# Patient Record
Sex: Female | Born: 1985 | Race: Black or African American | Hispanic: No | Marital: Single | Smoking: Never smoker
Health system: Southern US, Community
[De-identification: ages and names within clinical notes are randomized; demographics above are authoritative.]

## PROBLEM LIST (undated history)

## (undated) ENCOUNTER — Inpatient Hospital Stay (HOSPITAL_COMMUNITY): Payer: Self-pay

## (undated) DIAGNOSIS — Z87898 Personal history of other specified conditions: Secondary | ICD-10-CM

## (undated) DIAGNOSIS — IMO0002 Reserved for concepts with insufficient information to code with codable children: Secondary | ICD-10-CM

## (undated) DIAGNOSIS — D649 Anemia, unspecified: Secondary | ICD-10-CM

## (undated) DIAGNOSIS — T7840XA Allergy, unspecified, initial encounter: Secondary | ICD-10-CM

## (undated) DIAGNOSIS — B379 Candidiasis, unspecified: Secondary | ICD-10-CM

## (undated) DIAGNOSIS — Z8719 Personal history of other diseases of the digestive system: Secondary | ICD-10-CM

## (undated) DIAGNOSIS — I8393 Asymptomatic varicose veins of bilateral lower extremities: Secondary | ICD-10-CM

## (undated) DIAGNOSIS — Z9889 Other specified postprocedural states: Secondary | ICD-10-CM

## (undated) DIAGNOSIS — Z8619 Personal history of other infectious and parasitic diseases: Secondary | ICD-10-CM

## (undated) DIAGNOSIS — A599 Trichomoniasis, unspecified: Secondary | ICD-10-CM

## (undated) HISTORY — DX: Candidiasis, unspecified: B37.9

## (undated) HISTORY — DX: Other specified postprocedural states: Z98.890

## (undated) HISTORY — DX: Asymptomatic varicose veins of bilateral lower extremities: I83.93

## (undated) HISTORY — DX: Personal history of other infectious and parasitic diseases: Z86.19

## (undated) HISTORY — DX: Personal history of other diseases of the digestive system: Z87.19

## (undated) HISTORY — DX: Reserved for concepts with insufficient information to code with codable children: IMO0002

## (undated) HISTORY — DX: Anemia, unspecified: D64.9

## (undated) HISTORY — PX: WISDOM TOOTH EXTRACTION: SHX21

## (undated) HISTORY — DX: Personal history of other specified conditions: Z87.898

## (undated) HISTORY — DX: Allergy, unspecified, initial encounter: T78.40XA

## (undated) HISTORY — DX: Trichomoniasis, unspecified: A59.9

---
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
## 2008-06-12 ENCOUNTER — Inpatient Hospital Stay (HOSPITAL_COMMUNITY): Admission: AD | Admit: 2008-06-12 | Discharge: 2008-06-16 | Payer: Self-pay | Admitting: Obstetrics and Gynecology

## 2008-06-12 DIAGNOSIS — D649 Anemia, unspecified: Secondary | ICD-10-CM

## 2008-06-12 HISTORY — DX: Anemia, unspecified: D64.9

## 2010-08-13 LAB — CBC
HCT: 41.6 % (ref 36.0–46.0)
Hemoglobin: 13.6 g/dL (ref 12.0–15.0)
Hemoglobin: 10.8 g/dL — ABNORMAL LOW (ref 12.0–15.0)
MCHC: 33.7 g/dL (ref 30.0–36.0)
MCV: 81.6 fL (ref 78.0–100.0)
MCV: 81.6 fL (ref 78.0–100.0)
Platelets: 205 10*3/uL (ref 150–400)
RBC: 3.93 MIL/uL (ref 3.87–5.11)
RDW: 14.9 % (ref 11.5–15.5)
RDW: 14.7 % (ref 11.5–15.5)
WBC: 9.8 10*3/uL (ref 4.0–10.5)

## 2010-09-10 NOTE — H&P (Signed)
NAMESHANNARA, WINBUSH               ACCOUNT NO.:  [id number]   MEDICAL RECORD NO.:  [id number]          PATIENT TYPE:  INP   LOCATION:  9170                          [facility]ILITY[facility]  [facility]   PHYSICIAN:  Crist Fat. Rivard, M.D. DATE OF BIRTH:  [birth year]   DATE OF ADMISSION:  06/12/2008  DATE OF DISCHARGE:                              HISTORY & PHYSICAL   [name] is a 24 year old, G2, P0-0-1-0, at 40.0 weeks' gestation who  presents today with spontaneous rupture of membranes and clear fluid.  [name] is followed by the midwives at [facility].   Her pregnancy is remarkable for:  1. History of abnormal Pap.  2. History of STDs.   PRENATAL LABORATORY DATA:  Include an initial hemoglobin of 12.8,  hematocrit 38.5, platelets 250,000.  Blood type A+, antibody screen  negative, sickle cell trait screen negative, RPR nonreactive, rubella  titer immune, hepatitis B negative, HIV negative, Pap normal, gonorrhea  negative, Chlamydia negative, 27-week Glucola normal at 114, and GBS  positive at 36 weeks.   HISTORY OF PRESENT PREGNANCY:  [name] presented for prenatal care  at 10 weeks' gestation requesting midwifery management.  Two weeks  later, she had a normal first trimester screen.  At 19 weeks, she had an  anatomy ultrasound which showed a single intrauterine pregnancy, normal  fluid, normal anatomy, a cervix 3.52 cm, size concordant with dates,  normal placenta, three-vessel cord.  At 23 weeks, she began to have  problems with hemorrhoids and round ligament pain.  She was given  ProctoFoam and counseled on appropriate foot wear and back and pelvic  exercises.  At 27 weeks, she had her Glucola challenge test which was  normal and her H1N1 vaccine.  At 29 weeks, she was treated for a yeast  infection with a prescription of Terazol 7 cream.  The last month of her  pregnancy has been unremarkable except for the positive GBS culture on  January 19.   CURRENT MEDICATIONS:   Prenatal vitamin.   OBSTETRICAL HISTORY:  Rhett Bannister 1 was a first trimester termination in  January 2008.  Gravida 2 present pregnancy.   ALLERGIES:  [name] does not have any medication, food, or latex  allergies.   MEDICAL HISTORY:  [name] did have an abnormal Pap and a colposcopy  back in 2004.  She had a termination in 2008.  She had history of  Trichomonas.  She has had some yeast infections in the past.   SURGICAL HISTORY:  Her only surgery was the termination of pregnancy.   FAMILY HISTORY:  Her maternal grandmother has chronic hypertension.  Her  half sister has varicose veins.  Her nephew has asthma.  Her maternal  grandmother and maternal grandfather have diabetes.  Her cousin is on  dialysis.  Her maternal grandmother has breast cancer.  Her mother has  been a victim of domestic vitamins including physical and emotional  abuse.  Her maternal grandmother, maternal grandfather, maternal aunt,  maternal uncle have nicotine addictions.  Her maternal uncles have  problems with alcohol and drugs, and she has a  maternal aunt with  scoliosis.  Her maternal grandmother had twins.   GENETIC HISTORY:  [name] is negative for sickle cell trait  screening.  There are no other contributing genetic risk factors.   SOCIAL HISTORY:  [name] is a single black female with 16 years of  education who works in Producer, television/film/video.  Her partner is named Radio producer .  He has 13 years of education and works in detail.  [name] states  her religion as [employer].  She reports 1 drink on May 31 and denies any  further use of alcohol, tobacco or any street drug use during the  pregnancy.   PHYSICAL EXAMINATION:  Was within normal limits.  HEENT:  Normal.  LUNGS:  Clear to auscultation bilaterally.  HEART:  Regular rate and rhythm without murmurs.  BREASTS:  Soft, nontender.  ABDOMEN:  Gravid, 40 cm, fundal height appropriate for gestational age.  Soft uterine resting tone between  contractions.  EXTREMITIES:  Without edema.  Normal DTRs.  Negative Homans and clonus.  Fetal heart rate has a baseline 120 with some early reactivity on  arrival and a current sleep pattern.  VAGINAL EXAM:  was 160, -3, somewhat ballotable, vertex.  There was an  irregular mild contraction pattern present.   IMPRESSION:  A 25 year old G2, P0-0-1-0 at 40.0 weeks, spontaneous  rupture of membranes, minor contractions, positive GBS, reassuring fetal  heart rate.   PLAN:  Admit to birthing suite.  Routine admission orders.  Penicillin  prophylaxis for GBS.  The patient may ambulate once first penicillin  dose is in and reactivity of fetal heart rate strip is again obtained.  Consider the use of Pitocin if the contraction pattern does not improve  in the next two hours.  Anticipate NSVD.  CNM management.  Notify M.D.  on the service.      Janna Melsness, CNM      Sandra A. Rivard, M.D.  Electronically Signed    JM/MEDQ  D:  06/12/2008  T:  06/12/2008  Job:  [id number]

## 2010-09-10 NOTE — Op Note (Signed)
NAMELIESL, SIMONS               ACCOUNT NO.:  [id number]   MEDICAL RECORD NO.:  [id number]          PATIENT TYPE:  INP   LOCATION:  9170                          [facility]  [facility]   PHYSICIAN:  Janine Limbo, M.D.DATE OF BIRTH:  07/19/1985   DATE OF PROCEDURE:  DATE OF DISCHARGE:                               OPERATIVE REPORT   PREOPERATIVE DIAGNOSES:  1. 40-1/[redacted] weeks gestation.  2. Failure to progress in labor.  3. Meconium-stained amniotic fluid.   POSTOPERATIVE DIAGNOSES:  1. 40-1/[redacted] weeks gestation.  2. Failure to progress in labor.  3. Meconium-stained amniotic fluid.  4. Persistent occiput transverse presentation.   PROCEDURE:  Primary low-transverse cesarean section.   SURGEON:  Janine Limbo, MD   FIRST ASSISTANT:  Eulogio Bear, CNM.   ANESTHESIA:  Spinal.   DISPOSITION:  Sharon Mcbride is a 25 year old female, gravida 2, para 0-0-1-  0, who has been followed at the [facility].  The patient  presented to the [facility] [place] on February 15 at  approximately 4:30 a.m.  She was noted to have spontaneous rupture of  membranes.  Her cervix was 1 cm dilated, 60% effaced, and the presenting  part was at a -3 station.  The patient labored very slowly throughout  the day.  She was started on Pitocin.  She dilated her cervix to 5 cm  but progressed no further in spite of adequate [place] units.  She  was noted to have persistent variable decelerations with each of her  contractions.  We discussed our management options and the patient  elected to proceed with cesarean section.  The specific risk of cesarean  section were reviewed including, but not limited to, anesthetic  complications, bleeding, infections, and possible damage to surrounding  organs.   FINDINGS:  A 6 pounds 13 ounces female infant Kyung Rudd) was delivered  from a persistent left occiput transverse  presentation.  The Apgar  scores were 5 at 1 minute 7 and at 5 minutes.  The uterus was noted to  be normal for the gravid state.   PROCEDURE IN DETAIL:  The patient was taken to the operating room where  a spinal anesthetic was given.  The patient's abdomen, perineum, and  vagina were prepped with multiple layers of Betadine.  Foley catheter  was placed in the bladder.  The patient was then sterilely draped.  The  lower abdomen was injected with 10 mL of 0.5%  Marcaine with  epinephrine.  A low transverse incision was made in the abdomen and  carried sharply through the subcutaneous tissue, fascia, and the  anterior peritoneum.  An incision was made in the lower uterine segment  and extended in a low transverse fashion.  The fetal head was delivered.  The mouth and nose were suctioned using the DeLee trap.  A nuchal cord  was reduced.  The remainder of the infant was then delivered.  The cord  was clamped and cut and infant was handed to the awaiting pediatric  team.  Routine cord blood studies  were obtained.  The placenta was then  removed.  The placenta was given to the cord blood registry team.  The  uterine cavity was cleaned of amniotic fluid, clotted blood, and  membranes.  Uterine incision was closed using a running locking suture  of 2-0 Vicryl followed by an imbricating suture of 2-0 Vicryl.  Hemostasis was achieved using figure-of-eight sutures of 2-0 Vicryl.  The pelvis was vigorously irrigated.  The anterior peritoneum and the  abdominal musculature were reapproximated in the midline using 2-0  Vicryl.  The fascia was irrigated.  The fascia was then closed using a  running suture of zero Vicryl followed by three interrupted sutures of 0  Vicryl.  The subcutaneous layer was closed using a running suture of 0  Vicryl.  The skin was reapproximated using a subcuticular suture of 3-0  Monocryl.  Sponge, needle, and instrument counts were correct on two  occasions.  The estimated  blood loss was 800 mL.  The patient tolerated  her procedure well.  The patient was taken to the recovery room in  stable condition.  She was noted to drain clear yellow urine.  The  infant was taken to the full-term nursery in stable condition.      Janine Limbo, M.D.  Electronically Signed     AVS/MEDQ  D:  06/13/2008  T:  06/13/2008  Job:  [id number]

## 2010-09-13 NOTE — Discharge Summary (Signed)
[name], Mcbride               ACCOUNT NO.:  [id number]   MEDICAL RECORD NO.:  [id number]          PATIENT TYPE:  INP   LOCATION:  9130                          [facility]   PHYSICIAN:  Crist Fat. Rivard, M.D. DATE OF BIRTH:  10-25-1985   DATE OF ADMISSION:  06/12/2008  DATE OF DISCHARGE:  06/16/2008                               DISCHARGE SUMMARY   [name] is a 25 year old gravida 2, now para 1-0-1-1, who was  admitted for the delivery of her daughter, Joycie Peek who was born on  June 13, 2010, at 3:09 a.m. and weighed 6 pounds 13 ounces with  Apgar scores of 5 at 1 minute and 7 at 5 minutes.  She was delivered by  cesarean section.   ADMISSION DIAGNOSES:  1. Intrauterine pregnancy at 40 weeks.  2. Spontaneous rupture of membranes.  3. Positive group B streptococcus.  4. History of abnormal Pap/colpo.   DISCHARGE DIAGNOSES:  1. Three days status post primary low transverse cesarean section for      full-term pregnancy.  2. Spontaneous rupture of membranes x24 hours before delivery.  3. Meconium-stained fluid.  4. History of abnormal Pap/colpo.  5. Anemia.   PERTINENT LABORATORY DATA:  [name] is A+, rubella immune, and  positive for GBS.  WBC 10.0 (9.8), HGB 10.8 (13.60, HCT 32.1 (41.6), PLT  163 (205).   PROCEDURES:  Pitocin and penicillin per protocol.  Spinal anesthesia,  primary LTCS.  On day of discharge, Ms. Collins was afebrile with stable  vital signs.  Her physical exam was normal and unremarkable.  Her lungs  were clear to auscultation bilaterally.  Her heart had regular rate and  rhythm without murmurs.  Her breasts were soft and filling with breast-  feeding progressing well.  Her abdomen was soft without marked  distention.  Her fundus was firm and below the umbilicus.  She had trace  edema of bilateral lower extremities with normal deep tendon reflexes  and negative Denna Haggard' x2.  Her lochia was very scant.   PLANS:  To have Mirena IUD  inserted at 2 months postpartum and she  declines any interim birth control such as low-dose progesterone only  birth-control pills.   DISCHARGE MEDICATIONS:  Prenatal vitamins, Motrin 600 mg, and Percocet.   [name] was provided with the postpartum discharge instruction  booklet and the danger signs of postpartum and postop were reviewed with  the patient.  Smart start nurse to check on the patient at home few days  after discharge and followup appointment in office at 6 weeks'  postpartum or sooner p.r.n. problems.  [name] was deemed to have  received full benefit from the hospitalization after delivery of her  daughter Joycie Peek.      Janna Melsness, CNM      Sandra A. Rivard, M.D.  Electronically Signed    JM/MEDQ  D:  06/18/2008  T:  06/19/2008  Job:  [id number]

## 2010-12-03 DIAGNOSIS — Z8719 Personal history of other diseases of the digestive system: Secondary | ICD-10-CM

## 2010-12-03 HISTORY — DX: Personal history of other diseases of the digestive system: Z87.19

## 2011-08-11 ENCOUNTER — Ambulatory Visit (INDEPENDENT_AMBULATORY_CARE_PROVIDER_SITE_OTHER): Payer: Private Health Insurance - Indemnity | Admitting: Obstetrics and Gynecology

## 2011-08-11 ENCOUNTER — Encounter: Payer: Self-pay | Admitting: Obstetrics and Gynecology

## 2011-08-11 ENCOUNTER — Telehealth: Payer: Self-pay | Admitting: Obstetrics and Gynecology

## 2011-08-11 VITALS — BP 104/70 | HR 74 | Ht 64.0 in | Wt 167.0 lb

## 2011-08-11 DIAGNOSIS — D649 Anemia, unspecified: Secondary | ICD-10-CM | POA: Insufficient documentation

## 2011-08-11 DIAGNOSIS — N949 Unspecified condition associated with female genital organs and menstrual cycle: Secondary | ICD-10-CM

## 2011-08-11 DIAGNOSIS — Z9889 Other specified postprocedural states: Secondary | ICD-10-CM

## 2011-08-11 DIAGNOSIS — Z98891 History of uterine scar from previous surgery: Secondary | ICD-10-CM

## 2011-08-11 DIAGNOSIS — R102 Pelvic and perineal pain: Secondary | ICD-10-CM

## 2011-08-11 DIAGNOSIS — O209 Hemorrhage in early pregnancy, unspecified: Secondary | ICD-10-CM

## 2011-08-11 DIAGNOSIS — Z331 Pregnant state, incidental: Secondary | ICD-10-CM | POA: Insufficient documentation

## 2011-08-11 LAB — POCT URINALYSIS DIPSTICK
Bilirubin, UA: NEGATIVE
Ketones, UA: NEGATIVE
Leukocytes, UA: NEGATIVE
Protein, UA: NEGATIVE
pH, UA: 5

## 2011-08-11 NOTE — Progress Notes (Signed)
Patient is 6w 5d pregnant according to LMP c/o light brown discharge since a.m.-only with wiping. Has been having some crampiness since she's been pregnant. Denies change in BM, uti sx, dyspareunia or flank pain.  Patient was on Mirena until it was expelled in December.  January menses was not her usual in that it was longer in duration and February was slightly shorter than usual. Is certain however, that 06/24/11 was the first day of her bleeding in February.  O: Abdomen: soft, non-tender and without masses      Pelvic: EGBUS-wnl; vagina: with small  light brown        discharge in the vaginal vault; cervix-long/closed;      uterus-NSSC, mildly tender; adnexae-no masses       or tenderness   Wet Prep: 4.5  - whiff  negative clue, yeast, trich GC/CT-collected U/A SG 1.020 pH 5 trace blood otherwise negative UPT:  +   Assessment: First Trimester Bleeding   Plan: Quantitative HCG, if >= 2500 will schedule ultrasound otherwise if , 2500 will repeat in 48 hours  GC/CT, ABO Rh, antibody screen-pending  Ectopic precautions; call for excessive bleeding or pain  RTO for ultrasound per above plan

## 2011-08-11 NOTE — Telephone Encounter (Signed)
PT CALLED, IS 6WKS 6 DAYS, LMP 06/24/11, THIS AM AROUND 10, WIPED AND SAW THIN BROWN D/C ON TISSUE, AND SAW AGAIN @ 12PM, DENIES ANY RECENT IC, THINKS BLOOD TYPE IS A+ AND HAS SOME MILD CRAMPING, PER EP OK TO WORK IN, APPT TODAY AT 1630 FOR EVAL.

## 2011-08-11 NOTE — Patient Instructions (Addendum)
Call for excessive bleeding or pain.  Do not place anything in vagina until after you have an ultrasound follow-up  Patient to receive a note for work

## 2011-08-12 ENCOUNTER — Ambulatory Visit (INDEPENDENT_AMBULATORY_CARE_PROVIDER_SITE_OTHER): Payer: Private Health Insurance - Indemnity

## 2011-08-12 ENCOUNTER — Other Ambulatory Visit: Payer: Self-pay | Admitting: Obstetrics and Gynecology

## 2011-08-12 ENCOUNTER — Telehealth: Payer: Self-pay | Admitting: Obstetrics and Gynecology

## 2011-08-12 ENCOUNTER — Ambulatory Visit (INDEPENDENT_AMBULATORY_CARE_PROVIDER_SITE_OTHER): Payer: Private Health Insurance - Indemnity | Admitting: Obstetrics and Gynecology

## 2011-08-12 ENCOUNTER — Encounter: Payer: Self-pay | Admitting: Obstetrics and Gynecology

## 2011-08-12 VITALS — BP 100/78 | HR 80 | Wt 168.0 lb

## 2011-08-12 DIAGNOSIS — N949 Unspecified condition associated with female genital organs and menstrual cycle: Secondary | ICD-10-CM

## 2011-08-12 DIAGNOSIS — O209 Hemorrhage in early pregnancy, unspecified: Secondary | ICD-10-CM

## 2011-08-12 DIAGNOSIS — R102 Pelvic and perineal pain: Secondary | ICD-10-CM

## 2011-08-12 LAB — US OB TRANSVAGINAL

## 2011-08-12 LAB — HCG, QUANTITATIVE, PREGNANCY: hCG, Beta Chain, Quant, S: 62617.3 m[IU]/mL

## 2011-08-12 NOTE — Telephone Encounter (Signed)
Patient called and informed of ultrasound appointment today at 4 pm.  Patient was agreeable. She denies any increase in her vaginal discharge or pelvic discomfort.

## 2011-08-12 NOTE — Patient Instructions (Signed)
Return to office as scheduled @ 11 am 08/15/2011

## 2011-08-12 NOTE — Progress Notes (Signed)
Patient seen yesterday for first trimester bleeding returns for ultrasound.   O: Ultrasound:  6w 4d with FHR 142  A: First Trimester Bleeding  P: NOB 08/15/2011

## 2011-08-14 ENCOUNTER — Encounter: Payer: Private Health Insurance - Indemnity | Admitting: Obstetrics and Gynecology

## 2011-08-15 ENCOUNTER — Ambulatory Visit (INDEPENDENT_AMBULATORY_CARE_PROVIDER_SITE_OTHER): Payer: Private Health Insurance - Indemnity | Admitting: Obstetrics and Gynecology

## 2011-08-15 DIAGNOSIS — I8393 Asymptomatic varicose veins of bilateral lower extremities: Secondary | ICD-10-CM

## 2011-08-15 DIAGNOSIS — I8390 Asymptomatic varicose veins of unspecified lower extremity: Secondary | ICD-10-CM

## 2011-08-18 ENCOUNTER — Other Ambulatory Visit: Payer: Self-pay | Admitting: Obstetrics and Gynecology

## 2011-08-18 DIAGNOSIS — I8393 Asymptomatic varicose veins of bilateral lower extremities: Secondary | ICD-10-CM | POA: Insufficient documentation

## 2011-08-18 DIAGNOSIS — Z331 Pregnant state, incidental: Secondary | ICD-10-CM

## 2011-08-19 ENCOUNTER — Other Ambulatory Visit (INDEPENDENT_AMBULATORY_CARE_PROVIDER_SITE_OTHER): Payer: Private Health Insurance - Indemnity

## 2011-08-19 DIAGNOSIS — Z331 Pregnant state, incidental: Secondary | ICD-10-CM

## 2011-08-19 LAB — POCT URINALYSIS DIPSTICK
Blood, UA: NEGATIVE
Glucose, UA: NEGATIVE
Nitrite, UA: NEGATIVE
Protein, UA: NEGATIVE
Spec Grav, UA: 1.02
Urobilinogen, UA: NEGATIVE

## 2011-08-20 LAB — PRENATAL PANEL VII
Antibody Screen: NEGATIVE
Basophils Relative: 0 % (ref 0–1)
Eosinophils Absolute: 0.1 10*3/uL (ref 0.0–0.7)
Eosinophils Relative: 1 % (ref 0–5)
Hepatitis B Surface Ag: NEGATIVE
Lymphs Abs: 1.3 10*3/uL (ref 0.7–4.0)
MCH: 26.3 pg (ref 26.0–34.0)
MCHC: 33.5 g/dL (ref 30.0–36.0)
MCV: 78.4 fL (ref 78.0–100.0)
Neutrophils Relative %: 71 % (ref 43–77)
Platelets: 220 10*3/uL (ref 150–400)
RBC: 4.99 MIL/uL (ref 3.87–5.11)
RDW: 14.2 % (ref 11.5–15.5)
Rubella: 21.1 IU/mL — ABNORMAL HIGH

## 2011-08-21 LAB — CULTURE, OB URINE
Colony Count: NO GROWTH
Organism ID, Bacteria: NO GROWTH

## 2011-08-22 ENCOUNTER — Inpatient Hospital Stay (HOSPITAL_COMMUNITY)
Admission: AD | Admit: 2011-08-22 | Discharge: 2011-08-22 | Disposition: A | Discharge status: Home / Self Care | Payer: Private Health Insurance - Indemnity | Source: Ambulatory Visit | Attending: Obstetrics and Gynecology | Admitting: Obstetrics and Gynecology

## 2011-08-22 ENCOUNTER — Encounter (HOSPITAL_COMMUNITY): Payer: Self-pay

## 2011-08-22 ENCOUNTER — Telehealth: Payer: Self-pay | Admitting: Obstetrics and Gynecology

## 2011-08-22 DIAGNOSIS — O209 Hemorrhage in early pregnancy, unspecified: Secondary | ICD-10-CM

## 2011-08-22 DIAGNOSIS — O200 Threatened abortion: Secondary | ICD-10-CM

## 2011-08-22 NOTE — MAU Note (Signed)
HSteelman, CNM notified pt in MAU for early pregnancy bleeding, bright red smear noted. Denies pain. CNM to come see pt in MAU shortly.

## 2011-08-22 NOTE — Telephone Encounter (Signed)
Come to MAU for evaluation of bleeding. Sharon Mcbride, CNM

## 2011-08-22 NOTE — MAU Note (Signed)
Pt states had u/s completed last week r/t brown spotting. This am noted bright red smear of blood, denies abnormal vaginal d/c prior to, denies pain at present.

## 2011-08-22 NOTE — Discharge Instructions (Signed)
Vaginal Bleeding During Pregnancy, First Trimester  A small amount of bleeding (spotting) is relatively common in early pregnancy. It usually stops on its own. There are many causes for bleeding or spotting in early pregnancy. Some bleeding may be related to the pregnancy and some may not. Cramping with the bleeding is more serious and concerning. Tell your caregiver if you have any vaginal bleeding.   CAUSES    It is normal in most cases.   The pregnancy ends (miscarriage).   The pregnancy may end (threatened miscarriage).   Infection or inflammation of the cervix.   Growths (polyps) on the cervix.   Pregnancy happens outside of the uterus and in a fallopian tube (tubal pregnancy).   Many tiny cysts in the uterus instead of pregnancy tissue (molar pregnancy).  SYMPTOMS   Vaginal bleeding or spotting with or without cramps.  DIAGNOSIS   To evaluate the pregnancy, your caregiver may:   Do a pelvic exam.   Take blood tests.   Do an ultrasound.  It is very important to follow your caregiver's instructions.   TREATMENT    Evaluation of the pregnancy with blood tests and ultrasound.   Bed rest (getting up to use the bathroom only).   Rho-gam immunization if the mother is Rh negative and the father is Rh positive.  HOME CARE INSTRUCTIONS    If your caregiver orders bed rest, you may need to make arrangements for the care of other children and for other responsibilities. However, your caregiver may allow you to continue light activity.   Keep track of the number of pads you use each day, how often you change pads and how soaked (saturated) they are. Write this down.   Do not use tampons. Do not douche.   Do not have sexual intercourse or orgasms until approved by your physician.   Save any tissue that you pass for your caregiver to see.   Take medicine for cramps only with your caregiver's permission.   Do not take aspirin because it can make you bleed.  SEEK IMMEDIATE MEDICAL CARE IF:    You  experience severe cramps in your stomach, back or belly (abdomen).   You have an oral temperature above 102 F (38.9 C), not controlled by medicine.   You pass large clots or tissue.   Your bleeding increases or you become light-headed, weak or have fainting episodes.   You develop chills.   You are leaking or have a gush of fluid from your vagina.   You pass out while having a bowel movement. That may mean you have a ruptured tubal pregnancy.  Document Released: 01/22/2005 Document Revised: 04/03/2011 Document Reviewed: 08/03/2008  Exit[employer] Patient Information 2012 [employer].

## 2011-08-22 NOTE — MAU Provider Note (Signed)
History   Sharon Mcbride is a 26y.o. Married black female at 8.2 weeks per [facility] 03/30[facility] who presents for eval of Bright red, painless vaginal bleeding w/ wiping this AM.  No recent IC.  Eval at [facility] for brown spotting on 4/15, and no further bleeding until this AM.  C/o nausea, but no emesis.  She denies any recent illness, fever; no other GI c/o's, and no UTI s/s.  Neg gc/ct and urine cx's on 4/15.  Prenatal labs drawn 4/23 and WNL.  Pt accompanied by her husband.  She had a normal viability  U/s on 4/16 at [facility]--no evidence for bleeding on that u/s; the u/s aua was c/w LMP EDC (variation =3d), normal ovaries and adnexa and SIUP w/ FHR=143bpm and YS seen.   Pregnancy r/f: 1.  Previous c/s 2.  H/o abnl pap 3.  H/o std 4.  H/o EAB x1 w/ G1  CSN: [id number]  Arrival date and time: 08/22/11 4098   First Provider Initiated Contact with Patient 08/22/11 [id number]      Chief Complaint  Patient presents with  . Vaginal Bleeding   HPI  OB History    Grav Para Term Preterm Abortions TAB SAB Ect Mult Living   3 1 1  1     1       Past Medical History  Diagnosis Date  . Allergy     seasonal  . H/O varicella   . Yeast infection   . Trichomonas   . H/O hemorrhoids 12/03/10  . Abnormal Pap smear 2004    colpo.,2004; inflammation, 2006  . H/O candidiasis   . History of hepatitis B     vaccine  . Anemia 06/12/2008  . Varicose veins of legs     HAD TX SCHED WITH SPECIALISTS PRIOR TO +UPT    Past Surgical History  Procedure Date  . Cesarean section     primary low -transverse  . Wisdom tooth extraction     ONLY 2 TEETH SO FAR    Family History  Problem Relation Age of Onset  . Diabetes Maternal Grandmother   . Hypertension Maternal Grandmother   . Cancer Paternal Grandmother   . Alcohol abuse Maternal Uncle   . Drug abuse Maternal Uncle   . Diabetes Maternal Grandfather   . Kidney disease Cousin   . Anemia Mother   . Kidney disease Maternal Uncle     Dialysis  . Anesthesia problems  Neg Hx     History  Substance Use Topics  . Smoking status: Never Smoker   . Smokeless tobacco: Never Used  . Alcohol Use: No    Allergies: No Known Allergies  Prescriptions prior to admission  Medication Sig Dispense Refill  . Prenatal Vit-Fe Fumarate-FA (PRENATAL MULTIVITAMIN) TABS Take 1 tablet by mouth every morning.        ROS--see history above Physical Exam   Blood pressure 111/60, pulse 73, temperature 98.5 F (36.9 C), temperature source Oral, resp. rate 16, height 5\' 4"  (1.626 m), weight 75.751 kg (167 lb), last menstrual period 06/24/2011.  Physical Exam  Constitutional: She is oriented to person, place, and time. She appears well-developed and well-nourished. No distress.  Eyes:       glasses  Cardiovascular: Normal rate.   Respiratory: Effort normal.  GI: Soft. She exhibits no distension and no mass. There is no tenderness. There is no rebound and no guarding.       Well-healed c/s scar  Genitourinary:  SSE:  sm amt of brown blood in vault and cleared w/ 2 Fox swabs. Cx: long/closed  Neurological: She is alert and oriented to person, place, and time. She has normal reflexes.  Skin: Skin is warm and dry.  Psychiatric: She has a normal mood and affect. Her behavior is normal. Judgment and thought content normal.    MAU Course  Procedures 1.  SSE--visualization only 2.  Bedside u/s with FHR visualized in 150's  Assessment and Plan  1.  IUP at 8.2 weeks 2.  1st trimester bleeding (2nd episode) 3.  Rh pos  1.  D/c'd home on pelvic rest with SAB and bleeding precautions 2.  F/u as scheduled at [facility], or prn worsening bleeding or other concerns    Yeni Jiggetts H 08/22/2011, 9:52 AM

## 2011-09-02 ENCOUNTER — Encounter: Payer: Self-pay | Admitting: Obstetrics and Gynecology

## 2011-09-02 ENCOUNTER — Ambulatory Visit (INDEPENDENT_AMBULATORY_CARE_PROVIDER_SITE_OTHER): Payer: Private Health Insurance - Indemnity | Admitting: Obstetrics and Gynecology

## 2011-09-02 ENCOUNTER — Ambulatory Visit (INDEPENDENT_AMBULATORY_CARE_PROVIDER_SITE_OTHER): Payer: Private Health Insurance - Indemnity

## 2011-09-02 VITALS — BP 104/58 | Wt 168.0 lb

## 2011-09-02 DIAGNOSIS — Z331 Pregnant state, incidental: Secondary | ICD-10-CM

## 2011-09-02 DIAGNOSIS — Z9889 Other specified postprocedural states: Secondary | ICD-10-CM

## 2011-09-02 DIAGNOSIS — Z98891 History of uterine scar from previous surgery: Secondary | ICD-10-CM

## 2011-09-02 DIAGNOSIS — Z3490 Encounter for supervision of normal pregnancy, unspecified, unspecified trimester: Secondary | ICD-10-CM

## 2011-09-02 LAB — US OB LIMITED

## 2011-09-02 MED ORDER — PROMETHAZINE HCL 25 MG PO TABS: 25.0000 mg | ORAL_TABLET | Freq: Four times a day (QID) | ORAL | Status: DC | PRN

## 2011-09-02 NOTE — Progress Notes (Addendum)
Subjective:    Sharon Mcbride is being seen today for her first obstetrical visit. C/o some N/V. She is at [redacted]w[redacted]d gestation by LMP and early [place]. No bleeding in 1 1/2 weeks.  Her obstetrical history is significant for : 1st trimester bleeding Previous C/S--desires VBAC Relationship with FOB: significant other, living together. Present with her today. Patient does intend to breast feed.  Pregnancy history fully reviewed.   Review of Systems Pertinent ROS is described in HPI   Objective:   BP 104/58  Wt 168 lb (76.204 kg)  LMP 06/24/2011 Wt Readings from Last 1 Encounters:  09/02/11 168 lb (76.204 kg)   BMI: There is no height on file to calculate BMI.  General: alert, cooperative and no distress Respiratory: clear to auscultation bilaterally Cardiovascular: regular rate and rhythm, S1, S2 normal, no murmur Gastrointestinal: soft, non-tender; no masses,  no organomegaly Extremities: extremities normal, no pain or edema Vaginal Bleeding: none  EXTERNAL GENITALIA: normal appearing vulva with no masses, tenderness or lesions VAGINA: no abnormal discharge or lesions CERVIX: no lesions or cervical motion tenderness UTERUS: gravid and consistent with 10 weeks ADNEXA: no masses palpable and nontender OB EXAM PELVIMETRY: appears adequate   FHR:  150s   Assessment:    Pregnancy at 10 and 0/7 weeks  Pregnancy risk status:  complicated by  1st trimester bleeding Previous C/S--desires VBAC  Additional issues: None   Plan:    Check [place] for viability today. Desires 1st trimester screening. Prenatal panel reviewed and discussed with the patient:yes Pap smear collected:NA--due 8/13. GC/Chlamydia collected:Done at [facility] 4/13 OSOM BV: NA Discussion of First Screen and [place]: Plans 1st trimester screen  requested. Prenatal vitamins recommended Problem list reviewed and updated.  Plan of care: Follow up in 4 weeks. Additional testing planned: 1st trimester screen in 2  weeks Prescriptions given:Phenergan  Nigel Bridgeman, CNM, [place] 09/02/2011 3:20 PM

## 2011-09-02 NOTE — Progress Notes (Signed)
C/o nausea / vomiting

## 2011-09-03 NOTE — Assessment & Plan Note (Signed)
Desires VBAC at present.  Will get consent form. LTCS, double layer closure.

## 2011-09-16 ENCOUNTER — Ambulatory Visit (INDEPENDENT_AMBULATORY_CARE_PROVIDER_SITE_OTHER): Payer: Private Health Insurance - Indemnity

## 2011-09-16 ENCOUNTER — Other Ambulatory Visit: Payer: Self-pay | Admitting: Obstetrics and Gynecology

## 2011-09-16 DIAGNOSIS — O200 Threatened abortion: Secondary | ICD-10-CM

## 2011-09-16 DIAGNOSIS — Z3490 Encounter for supervision of normal pregnancy, unspecified, unspecified trimester: Secondary | ICD-10-CM

## 2011-09-16 LAB — US OB COMP LESS 14 WKS

## 2011-10-03 ENCOUNTER — Ambulatory Visit (INDEPENDENT_AMBULATORY_CARE_PROVIDER_SITE_OTHER): Payer: Private Health Insurance - Indemnity

## 2011-10-03 VITALS — BP 108/64 | Wt 166.0 lb

## 2011-10-03 DIAGNOSIS — Z87898 Personal history of other specified conditions: Secondary | ICD-10-CM

## 2011-10-03 DIAGNOSIS — Z9889 Other specified postprocedural states: Secondary | ICD-10-CM | POA: Insufficient documentation

## 2011-10-03 DIAGNOSIS — Z331 Pregnant state, incidental: Secondary | ICD-10-CM

## 2011-10-03 DIAGNOSIS — Z98891 History of uterine scar from previous surgery: Secondary | ICD-10-CM

## 2011-10-03 DIAGNOSIS — Z8619 Personal history of other infectious and parasitic diseases: Secondary | ICD-10-CM | POA: Insufficient documentation

## 2011-10-03 DIAGNOSIS — Z8742 Personal history of other diseases of the female genital tract: Secondary | ICD-10-CM

## 2011-10-03 DIAGNOSIS — Z3490 Encounter for supervision of normal pregnancy, unspecified, unspecified trimester: Secondary | ICD-10-CM

## 2011-10-03 HISTORY — DX: Personal history of other specified conditions: Z87.898

## 2011-10-03 HISTORY — DX: Other specified postprocedural states: Z98.890

## 2011-10-03 NOTE — Progress Notes (Signed)
[redacted]w[redacted]d.  2lb wt loss, but no further n/v.  Watch TWG.  No c/o's.  Husband at visit.   Previous c/s for FTP & persistent OT.  Rev'd Nml 1st trimester screen. Anatomy u/s and AFP NV (5 weeks)

## 2011-10-03 NOTE — Progress Notes (Signed)
Pt c/o after sitting up after listening to [facility] that she has recently noted chest pain when lying down, and sometimes radiates over shoulder to back.  Rec'd increasing water and to calendar when notes.  Cardio referral or more detailed w/u prn.

## 2011-10-03 NOTE — Progress Notes (Signed)
No complaints

## 2011-11-04 ENCOUNTER — Telehealth: Payer: Self-pay | Admitting: Obstetrics and Gynecology

## 2011-11-04 NOTE — Telephone Encounter (Signed)
Midwife pt

## 2011-11-04 NOTE — Telephone Encounter (Signed)
Pt reqs letter for piliates at [facility]. Pt can pick up tomorrow. Informed pt will consult with [facility].

## 2011-11-07 ENCOUNTER — Ambulatory Visit (INDEPENDENT_AMBULATORY_CARE_PROVIDER_SITE_OTHER): Payer: Private Health Insurance - Indemnity

## 2011-11-07 ENCOUNTER — Encounter: Payer: Self-pay | Admitting: Obstetrics and Gynecology

## 2011-11-07 ENCOUNTER — Ambulatory Visit (INDEPENDENT_AMBULATORY_CARE_PROVIDER_SITE_OTHER): Payer: Private Health Insurance - Indemnity | Admitting: Obstetrics and Gynecology

## 2011-11-07 VITALS — BP 100/60 | Wt 176.0 lb

## 2011-11-07 DIAGNOSIS — Z3689 Encounter for other specified antenatal screening: Secondary | ICD-10-CM

## 2011-11-07 DIAGNOSIS — Z1379 Encounter for other screening for genetic and chromosomal anomalies: Secondary | ICD-10-CM

## 2011-11-07 DIAGNOSIS — Z3490 Encounter for supervision of normal pregnancy, unspecified, unspecified trimester: Secondary | ICD-10-CM

## 2011-11-07 DIAGNOSIS — O359XX0 Maternal care for (suspected) fetal abnormality and damage, unspecified, not applicable or unspecified: Secondary | ICD-10-CM

## 2011-11-07 LAB — US OB COMP + 14 WK

## 2011-11-07 NOTE — Progress Notes (Signed)
AFP today

## 2011-11-07 NOTE — Progress Notes (Signed)
Doing well. [place] today:  Breech, posterior placenta, 19 2/7 weeks, c/w dates, 48%ile, 10 oz. LV EIF noted Umbilical hernia/omphalocele noted.  No involvement of liver, stomach, or small bowel. Cervix closed.    Plan: AFP today Refer to MFM for further evaluation. Still considering VBAC vs repeat C/S--leaning more toward VBAC.

## 2011-11-10 ENCOUNTER — Other Ambulatory Visit: Payer: Self-pay | Admitting: Obstetrics and Gynecology

## 2011-11-10 ENCOUNTER — Telehealth: Payer: Self-pay | Admitting: Obstetrics and Gynecology

## 2011-11-10 DIAGNOSIS — O358XX0 Maternal care for other (suspected) fetal abnormality and damage, not applicable or unspecified: Secondary | ICD-10-CM

## 2011-11-10 LAB — ALPHA FETOPROTEIN, MATERNAL
Curr Gest Age: 19.3 wks.days
MoM for AFP: 0.68
Osb Risk: <1:54600 {titer}

## 2011-11-10 NOTE — Telephone Encounter (Signed)
Per VL, pt scheduled at MFM 11/11/11 at 7:30 AM.  Pt notified of appt and is agreeable.

## 2011-11-11 ENCOUNTER — Ambulatory Visit (HOSPITAL_COMMUNITY)
Admission: RE | Admit: 2011-11-11 | Discharge: 2011-11-11 | Disposition: A | Discharge status: Home / Self Care | Payer: Private Health Insurance - Indemnity | Source: Ambulatory Visit | Attending: Obstetrics and Gynecology | Admitting: Obstetrics and Gynecology

## 2011-11-11 ENCOUNTER — Other Ambulatory Visit: Payer: Self-pay

## 2011-11-11 ENCOUNTER — Telehealth: Payer: Self-pay | Admitting: Obstetrics and Gynecology

## 2011-11-11 VITALS — BP 100/63 | HR 73 | Wt 176.0 lb

## 2011-11-11 DIAGNOSIS — Z1389 Encounter for screening for other disorder: Secondary | ICD-10-CM | POA: Insufficient documentation

## 2011-11-11 DIAGNOSIS — O359XX0 Maternal care for (suspected) fetal abnormality and damage, unspecified, not applicable or unspecified: Secondary | ICD-10-CM

## 2011-11-11 DIAGNOSIS — R9389 Abnormal findings on diagnostic imaging of other specified body structures: Secondary | ICD-10-CM | POA: Insufficient documentation

## 2011-11-11 DIAGNOSIS — O358XX0 Maternal care for other (suspected) fetal abnormality and damage, not applicable or unspecified: Secondary | ICD-10-CM

## 2011-11-11 DIAGNOSIS — Z363 Encounter for antenatal screening for malformations: Secondary | ICD-10-CM | POA: Insufficient documentation

## 2011-11-11 DIAGNOSIS — O3440 Maternal care for other abnormalities of cervix, unspecified trimester: Secondary | ICD-10-CM | POA: Insufficient documentation

## 2011-11-11 DIAGNOSIS — O34219 Maternal care for unspecified type scar from previous cesarean delivery: Secondary | ICD-10-CM | POA: Insufficient documentation

## 2011-11-11 NOTE — Consult Note (Signed)
Maternal Fetal Medicine Consultation  Requesting Provider(s): Nigel Bridgeman, CNM  Reason for consultation: ? Fetal umbilical hernia, EIF, normal first trimester screen  HPI: Sharon Mcbride is a 26 yo [id number] currently at 20 0/7 weeks who is seen today due to suspected fetal umbilical hernia and left ventricle EIF.  Her prenatal course has otherwise been uncomplicated except for some first trimester vaginal bleeding.  She is without complaints today.  Her first trimester screen and MSAFP were low risk for aneuploidy and ONTD.  OB History: OB History    Grav Para Term Preterm Abortions TAB SAB Ect Mult Living   3 1 1  1     1     early TAB, term C-section for failed induction  PMH:  Past Medical History  Diagnosis Date  . Allergy     seasonal  . H/O varicella   . Yeast infection   . Trichomonas   . H/O hemorrhoids 12/03/10  . Abnormal Pap smear 2004    colpo.,2004; inflammation, 2006  . H/O candidiasis   . History of hepatitis B     vaccine  . Anemia 06/12/2008  . Varicose veins of legs     HAD TX SCHED WITH SPECIALISTS PRIOR TO +UPT  . History of abnormal Pap smear 10/03/2011    colpo 2004  . History of induced abortion 10/03/2011    EAB 2008 at 12 weeks   PSH: Past Surgical History  Procedure Date  . Cesarean section     primary low -transverse  . Wisdom tooth extraction     ONLY 2 TEETH SO FAR    Meds: Prenatal vitamins  Allergies: NKDA  FH: neg for birth defects or hereditary disorders  Soc: non drinker, non smoker, denies illicit drug use  Review of Systems: no vaginal bleeding or cramping/contractions, no LOF, no nausea/vomiting. All other systems reviewed and are negative.    PE:  VS: BP: 100/63                    Pulse: 73                Weight: 176# GEN: well-appearing female ABD: gravid, NT  Please see separate document for fetal ultrasound report.  Single IUP at 20 0/7 weeks A small omphalocele is noted Echogenic intracardiac focus is appreciated in  the left ventricle The remainder of the fetal anatomy is within normal limits. Normal amniotic fluid volume  A/P: 1) fetal ophalocele - the findings and limitations of the study were discussed.  Omphalocele is associated with chromosome defects - although would have a low suspicion for Trisomy 18 due to an otherwise normal ultrasound and normal/ open hands.  After counseling, the patient elected to undergo cell free fetal DNA (Harmony test) but may consider amniocentesis at future appointment.  Arrangements were made for fetal echo with Peds cardiology. Recommend follow up ultrasound in 4 weeks to reevaluate.  Once results of the Harmony test return, will make arrangements for the patient to be seen by Peds surgery.  She will need to deliver at a Level III center with Peds surgery support.   Thank you for the opportunity to be a part of the care of Sharon Mcbride.  We will continue to follow her with ultrasounds in this office. Please contact our office if we can be of further assistance.   Alpha Gula, MD  I spent approximately 30 minutes with this patient with over 50% of time spent in face-to-face  counseling.

## 2011-11-11 NOTE — Telephone Encounter (Signed)
Sharon L. PT

## 2011-11-11 NOTE — Telephone Encounter (Signed)
Spoke with pt concerned about the abnormal fetal finding suspicious Trisomy 18 today at MFM. Pt wanted to know if Afp and Quad wnl. Informed pt both wnl. Pt also asked why did MFM draw blood for Afp again if it was wnl. Informed pt blood draw today was for a different genetic test called Harmony Prenatal Test which is a non-invasive test for fetal trisomy. Pt voiced understanding. Harmony ACOG mailed to pt.

## 2011-11-12 ENCOUNTER — Telehealth: Payer: Self-pay | Admitting: Obstetrics and Gynecology

## 2011-11-12 NOTE — Telephone Encounter (Signed)
TC to patient per her request to review MFM visit.  Fetus has small omphalocele, and was referred to MFM for evaluation.  Normal 1st trimester screen and normal AFP. Harmony testing done at MFM, and plan for referral for echo. Per MFM, patient will need to delivery at a Level III site with peds surgery available. MFM feels risk of Trisomy 18 is low, due to normal genetic screening and otherwise normal [place] structure.  Issues reviewed, questions and concerns discussed. Support to patient for likelihood of normal status, but MFM and [facility] will continue to follow the patient.

## 2011-11-21 ENCOUNTER — Telehealth (HOSPITAL_COMMUNITY): Payer: Self-pay | Admitting: MS"

## 2011-11-21 NOTE — Telephone Encounter (Signed)
Called Sharon Mcbride to discuss her Harmony, cell free fetal DNA testing.  We reviewed that these are within normal limits, showing a less than 1 in 10,000 risk for trisomies 21, 18 and 13.  We reviewed that this testing identifies > 99% of pregnancies with trisomy 21, >97% of pregnancies with trisomy 67, and >80% with trisomy 56; the false positive rate is <0.1% for all conditions.  She understands that this testing does not identify all genetic conditions, nor does it rule out the presence of the conditions screened.    We briefly reviewed that omphaloceles can be isolated or part of an underlying syndrome.  Specifically, we discussed that omphaloceles are associated with a 30-50% risk of an underlying condition. We reviewed the cell free fetal DNA testing cannot assess for all possible underlying conditions.  [name] has a follow-up ultrasound scheduled with MFM on 12/09/11. Also discussed the possibility of genetic counseling visit at that time to discuss the ultrasound finding of omphalocele in more detail. [name] asked about transfer of care to deliver at a center with pediatric surgery and that her midwife had discussed delivery at [facility]. Reviewed the option of meeting with a pediatric surgeon to review the surgical approach and postnatal management, which we will be happy to facilitate in the future.   All of [name] questions were answered to her satisfaction. She was encouraged to call with additional questions or concerns.   Quinn Plowman, MS Certified Genetic Counselor 11/21/2011 11:05 AM

## 2011-11-27 ENCOUNTER — Encounter: Payer: Self-pay | Admitting: Maternal and Fetal Medicine

## 2011-12-03 ENCOUNTER — Encounter (HOSPITAL_COMMUNITY): Payer: Self-pay | Admitting: Obstetrics and Gynecology

## 2011-12-04 ENCOUNTER — Ambulatory Visit (INDEPENDENT_AMBULATORY_CARE_PROVIDER_SITE_OTHER): Payer: Private Health Insurance - Indemnity | Admitting: Obstetrics and Gynecology

## 2011-12-04 VITALS — BP 90/62 | Temp 98.2°F | Wt 177.0 lb

## 2011-12-04 DIAGNOSIS — Z331 Pregnant state, incidental: Secondary | ICD-10-CM

## 2011-12-04 DIAGNOSIS — J3489 Other specified disorders of nose and nasal sinuses: Secondary | ICD-10-CM

## 2011-12-04 DIAGNOSIS — R0981 Nasal congestion: Secondary | ICD-10-CM

## 2011-12-04 MED ORDER — FLUTICASONE PROPIONATE 50 MCG/ACT NA SUSP: 2.0000 | Freq: Every day | NASAL | Status: DC

## 2011-12-04 NOTE — Progress Notes (Signed)
Doing well.  Had visit/call with MFM MD and genetic counselor. Has f/u on 8/13 for repeat US.  MFM recommends delivery at Level III institution with pediatric surgery services (i.e. [facility]).  MFM willing to arrange plan. Patient understands need for that recommendation and is agreeable with it.  Understands we will still be providers for any other issues and would be available as needed if emergency or labor ensued before delivery scheduled at [facility].   Reports sinus congestion and cold symptoms---no fever, throat no longer sore, no cough.  +sinus pain and fullness.  PE:: Chest clear Heart RRR without murmur Throat clear Ears--significant cerumen, but no evidence of infection +sinus pain to palpation. Boggy, edematous nasal turbinates.  Plan: Flonase nasal spray Tylenol, Vicks, etc for comfort. Glucola, Hgb, RPR NV.

## 2011-12-04 NOTE — Progress Notes (Signed)
C/o allergy sx's since Monday hasn't been able to work; Pt unsure which allergy meds are safe in pregnancy. Informed pt plain Zyrtec, plain Claritin, plain Mucinex etc.

## 2011-12-09 ENCOUNTER — Ambulatory Visit (HOSPITAL_COMMUNITY)
Admission: RE | Admit: 2011-12-09 | Discharge: 2011-12-09 | Disposition: A | Discharge status: Home / Self Care | Payer: Private Health Insurance - Indemnity | Source: Ambulatory Visit | Attending: Obstetrics and Gynecology | Admitting: Obstetrics and Gynecology

## 2011-12-09 DIAGNOSIS — O359XX0 Maternal care for (suspected) fetal abnormality and damage, unspecified, not applicable or unspecified: Secondary | ICD-10-CM

## 2011-12-09 DIAGNOSIS — O34219 Maternal care for unspecified type scar from previous cesarean delivery: Secondary | ICD-10-CM | POA: Insufficient documentation

## 2011-12-09 DIAGNOSIS — O3440 Maternal care for other abnormalities of cervix, unspecified trimester: Secondary | ICD-10-CM | POA: Insufficient documentation

## 2011-12-09 DIAGNOSIS — O358XX0 Maternal care for other (suspected) fetal abnormality and damage, not applicable or unspecified: Secondary | ICD-10-CM | POA: Insufficient documentation

## 2011-12-11 ENCOUNTER — Encounter (HOSPITAL_COMMUNITY): Payer: Self-pay

## 2011-12-11 DIAGNOSIS — O358XX0 Maternal care for other (suspected) fetal abnormality and damage, not applicable or unspecified: Secondary | ICD-10-CM | POA: Insufficient documentation

## 2011-12-11 NOTE — Progress Notes (Addendum)
Genetic Counseling  High-Risk Gestation Note  Appointment Date:  12/09/2011 Referred By: Kirkland Hun, MD Date of Birth:  Sep 23, 1985    Pregnancy History: [id number] Estimated Date of Delivery: 03/30/12 Estimated Gestational Age: [redacted]w[redacted]d Attending: Particia Nearing, MD  I met with Sharon Mcbride for genetic counseling because of the previous ultrasound finding of omphalocele.  We began by reviewing the ultrasound in detail. Previous ultrasound performed visualized the presence of an omphalocele. Today's follow-up ultrasound visualized very small omphalocele/umbilical hernia. Remaining visualized fetal anatomy appeared normal. Complete ultrasound reported separately.   We discussed that an omphalocele occurs in ~1 in every 4000 births (M1:F5) and is defined as the protrusion of abdominal viscera through the umbilical ring, covered by membrane.  This defect is thought to arise from failure of lateral body migration and body wall closure or from the embryonic persistence of the body stalk.  We discussed that ~two thirds of all cases have associated anomalies, most commonly including: cardiac defects, neural tube defects, and cleft lip with or without palate.  We reviewed the common causes of omphaloceles, including sporadic, multifactorial, and genetic etiologies.  Specifically, we discussed that omphaloceles are associated with a 30-50% risk of fetal aneuploidy, complexes such as OEIS, and genetic syndromes including Beckwith-Wiedemann syndrome (BWS) and single gene conditions.  We briefly reviewed recessive and dominant inheritances, since there are reports of familial nonsyndromic omphaloceles. The patient had a normal fetal echocardiogram on 11/27/11 through [facility].    Sharon Mcbride previously had noninvasive prenatal testing (NIPT) which indicated a low risk (less than 1 in 10,000) risk for fetal trisomies 21, 18, and 13. We reviewed that NIPT analyzes cell free fetal DNA found in the  maternal circulation. This test is not diagnostic for chromosome conditions, but can provide information regarding the presence or absence of extra fetal DNA for chromosomes 13, 18 and 21. Thus, it would not identify or rule out all fetal aneuploidy nor does it screen for additional genetic conditions. The reported detection rate is greater than 99% for Trisomy 21, greater than 97% for Trisomy 18, and is approximately 80% (8 out of 10) for Trisomy 13. The false positive rate is reported to be less than 0.1% for any of these conditions.  We reviewed the option of amniocentesis for prenatal diagnosis of chromosome conditions.  We discussed the benefits, limitations, and risks.  She declined this option.  We also discussed the option of a postnatal medical genetics evaluation, to help determine whether the child has an underlying genetic syndrome.   We discussed management and prognosis.  They understand that the overall prognosis depends upon the underlying etiology; however, if isolated and nonsyndromic, the prognosis is relatively good.  We reviewed the option of meeting with a pediatric surgeon to review the surgical approach and postnatal management and to determine whether or not delivery at a tertiary care center would be warranted, given the small size of omphalocele on today's ultrasound. Sharon Mcbride expressed interest in meeting with the pediatric surgeon at [facility]. We will facilitate this and contact the patient with this information. The plan for location of delivery will be reviewed following the prenatal consultation with pediatric surgery.   Both family histories were reviewed and found to be contributory for mental retardation in the patient's paternal uncle. Limited information was known regarding this relative's features, and an underlying etiology was not known. This uncle reportedly died at an old age. We discussed that there are many  different causes of mental  retardation such as genetic differences, sporadic causes, or injuries.  A specific diagnosis for mental retardation can be determined in approximately 50% of cases.  In the remaining 50% of cases, a diagnosis may not ever be determined.  Without more specific information, potential genetic risks cannot be determined.  The patient was advised to call if she is able to find out more information regarding a specific diagnosis. Without further information regarding the provided family history, an accurate genetic risk cannot be calculated. Further genetic counseling is warranted if more information is obtained.   Sharon Mcbride denied exposure to environmental toxins or chemical agents. She denied the use of alcohol, tobacco or street drugs. She denied significant viral illnesses during the course of her pregnancy. Her medical and surgical histories were noncontributory.   I counseled Sharon Mcbride regarding the above risks and available options.  The approximate face-to-face time with the genetic counselor was 25 minutes.  Quinn Plowman, MS Certified Genetic Counselor 12/11/2011

## 2011-12-25 ENCOUNTER — Telehealth (HOSPITAL_COMMUNITY): Payer: Self-pay | Admitting: MS"

## 2011-12-25 NOTE — Telephone Encounter (Signed)
Called [name] to inform her of prenatal consult with pediatric surgeon, Dr. Dell Ponto at [facility] on 01/28/12 at 1:30 pm. Provided [name] with address and phone number for [facility] ([address], [place], [place] [address]). Patient encouraged to call with additional questions or concerns.   Clydie Braun Kerah Hardebeck 12/25/2011 10:38 AM

## 2012-01-07 ENCOUNTER — Ambulatory Visit (INDEPENDENT_AMBULATORY_CARE_PROVIDER_SITE_OTHER): Payer: Private Health Insurance - Indemnity | Admitting: Obstetrics and Gynecology

## 2012-01-07 ENCOUNTER — Encounter: Payer: Self-pay | Admitting: Obstetrics and Gynecology

## 2012-01-07 ENCOUNTER — Other Ambulatory Visit: Payer: Private Health Insurance - Indemnity

## 2012-01-07 VITALS — BP 98/58 | Wt 180.0 lb

## 2012-01-07 DIAGNOSIS — Z331 Pregnant state, incidental: Secondary | ICD-10-CM

## 2012-01-07 NOTE — Progress Notes (Signed)
[redacted]w[redacted]d Glucola Given. Pt has no complaints.

## 2012-01-07 NOTE — Progress Notes (Signed)
Has appointment with pediatric surgeon at [facility] in early October. Glucola today, with Hgb and RPR. A+ type. Requests recommendation for peds at [facility] check list.

## 2012-01-19 ENCOUNTER — Ambulatory Visit (INDEPENDENT_AMBULATORY_CARE_PROVIDER_SITE_OTHER): Payer: Private Health Insurance - Indemnity | Admitting: Obstetrics and Gynecology

## 2012-01-19 ENCOUNTER — Encounter: Payer: Self-pay | Admitting: Obstetrics and Gynecology

## 2012-01-19 VITALS — BP 98/62 | Wt 182.0 lb

## 2012-01-19 DIAGNOSIS — Z331 Pregnant state, incidental: Secondary | ICD-10-CM

## 2012-01-19 DIAGNOSIS — Z3490 Encounter for supervision of normal pregnancy, unspecified, unspecified trimester: Secondary | ICD-10-CM

## 2012-01-19 NOTE — Progress Notes (Signed)
[redacted]w[redacted]d Pt has no concerns

## 2012-01-19 NOTE — Progress Notes (Signed)
[redacted]w[redacted]d No complaints No change in vaginal secretions FM+ Some acid reflux - Tums for same. ROB x 2 weeks

## 2012-02-06 ENCOUNTER — Ambulatory Visit (INDEPENDENT_AMBULATORY_CARE_PROVIDER_SITE_OTHER): Payer: Private Health Insurance - Indemnity | Admitting: Obstetrics and Gynecology

## 2012-02-06 ENCOUNTER — Encounter: Payer: Self-pay | Admitting: Obstetrics and Gynecology

## 2012-02-06 VITALS — BP 100/60 | Wt 182.0 lb

## 2012-02-06 DIAGNOSIS — Z331 Pregnant state, incidental: Secondary | ICD-10-CM

## 2012-02-06 LAB — POCT WET PREP (WET MOUNT)

## 2012-02-06 NOTE — Progress Notes (Signed)
Doing well, but more pelvic pressure.  Started walking for exercise recently. FFN done, with GBS, cultures, wet prep negative. Cervix closed, long, vtx, -2.  External os soft, but closed. Saw pediatric surgeon (Dr. Dell Ponto) at [facility] with plan of care for evaluation of baby after delivery. Will f/u with patient regarding FFN results.

## 2012-02-06 NOTE — Progress Notes (Signed)
[redacted]w[redacted]d Pt c/o increased vaginal pressure request cx check.

## 2012-02-17 ENCOUNTER — Encounter: Payer: Self-pay | Admitting: Obstetrics and Gynecology

## 2012-02-19 ENCOUNTER — Ambulatory Visit (INDEPENDENT_AMBULATORY_CARE_PROVIDER_SITE_OTHER): Payer: Private Health Insurance - Indemnity | Admitting: Obstetrics and Gynecology

## 2012-02-19 ENCOUNTER — Encounter: Payer: Self-pay | Admitting: Obstetrics and Gynecology

## 2012-02-19 VITALS — BP 106/62 | Wt 187.0 lb

## 2012-02-19 DIAGNOSIS — Q792 Exomphalos: Secondary | ICD-10-CM

## 2012-02-19 NOTE — Progress Notes (Signed)
[redacted]w[redacted]d The patient has an infant with an omphalocele.  Plans had been made for delivery in [place]. Questions answered. The patient will have an ultrasound and evaluation by maternal fetal medicine here in [place] next week. Return to our office in 2 weeks. Beta strep, gonorrhea, Chlamydia, and fetal fibronectin were negative from last visit. Dr. Stefano Gaul

## 2012-02-19 NOTE — Progress Notes (Signed)
[redacted]w[redacted]d  Increased pressure, pt states it is bearable.

## 2012-02-20 ENCOUNTER — Encounter: Payer: Private Health Insurance - Indemnity | Admitting: Obstetrics and Gynecology

## 2012-02-27 ENCOUNTER — Telehealth: Payer: Self-pay | Admitting: Obstetrics and Gynecology

## 2012-02-27 ENCOUNTER — Other Ambulatory Visit: Payer: Self-pay | Admitting: Obstetrics and Gynecology

## 2012-02-27 ENCOUNTER — Ambulatory Visit (INDEPENDENT_AMBULATORY_CARE_PROVIDER_SITE_OTHER): Payer: Private Health Insurance - Indemnity | Admitting: General Surgery

## 2012-02-27 ENCOUNTER — Encounter (HOSPITAL_COMMUNITY): Payer: Self-pay | Admitting: *Deleted

## 2012-02-27 ENCOUNTER — Encounter (INDEPENDENT_AMBULATORY_CARE_PROVIDER_SITE_OTHER): Payer: Self-pay | Admitting: General Surgery

## 2012-02-27 ENCOUNTER — Inpatient Hospital Stay (HOSPITAL_COMMUNITY)
Admission: AD | Admit: 2012-02-27 | Discharge: 2012-02-27 | Disposition: A | Discharge status: Home / Self Care | Payer: Private Health Insurance - Indemnity | Source: Ambulatory Visit | Attending: Obstetrics and Gynecology | Admitting: Obstetrics and Gynecology

## 2012-02-27 VITALS — BP 110/68 | HR 76 | Temp 97.0°F | Resp 16 | Ht 64.0 in | Wt 188.8 lb

## 2012-02-27 DIAGNOSIS — O228X9 Other venous complications in pregnancy, unspecified trimester: Secondary | ICD-10-CM | POA: Insufficient documentation

## 2012-02-27 DIAGNOSIS — Z331 Pregnant state, incidental: Secondary | ICD-10-CM

## 2012-02-27 DIAGNOSIS — K645 Perianal venous thrombosis: Secondary | ICD-10-CM

## 2012-02-27 DIAGNOSIS — K649 Unspecified hemorrhoids: Secondary | ICD-10-CM | POA: Insufficient documentation

## 2012-02-27 MED ORDER — PRAMOXINE HCL 1 % RE FOAM: RECTAL | Status: DC | PRN

## 2012-02-27 MED ORDER — HYDROCODONE-ACETAMINOPHEN 5-500 MG PO TABS: 1.0000 | ORAL_TABLET | Freq: Four times a day (QID) | ORAL | Status: DC | PRN

## 2012-02-27 NOTE — Telephone Encounter (Signed)
Message copied by Mason Jim on Fri Feb 27, 2012 10:15 AM ------      Message from: Malissa Hippo.      Created: Fri Feb 27, 2012  1:13 AM      Regarding: referral needed immediately        Pt is 35wks w thrombosed hemorroids and bleeding      Needs to GI doc immediately       Thanks      SL

## 2012-02-27 NOTE — MAU Note (Signed)
Pt having rectal bleeding from hemmrhoids this evening.  Pt 35.3wks G3 P1

## 2012-02-27 NOTE — Progress Notes (Signed)
S: pt called earlier w c/o worsening hemorrhoids and bleeding Denies any ctx, VB, LOF +FM States she's had mild hemorrhoids but have worsened in the last 2 days and tonight started bleeding  O: VSS FHR cat 1 toco some UI Rectum: 2 .5cm external hemorrhoids noted, purple and firm, not currently bleeding  A: thrombosed hemorrhoids Stable currently  P: d/w dr Normand Sloop Proctofoam RX Will consult w GI ASAP  Recommend stool softeners vicodin 5/325mg  1-2 tabs every 4-6hours disp #30 dc'd home

## 2012-02-27 NOTE — MAU Note (Signed)
Pt states she noticed bleeding tonight about 2130

## 2012-02-27 NOTE — Telephone Encounter (Signed)
Tc to DR [facility] office.  Do not treat pt's with thrombosed hemorrhoids. Per [facility] pt to be referred to general surgeon.  Appt Sched with DR Andrey Campanile at [facility] today at 2:00.  TC to pt.  Notified of appt. +FM.

## 2012-02-27 NOTE — Progress Notes (Signed)
Subjective:     Patient ID: Sharon Mcbride, female   DOB: 1985-06-26, 26 y.o.   MRN: [id number]  HPI 26 year old African American female who is approximately [redacted] weeks pregnant comes in complaining of severe rectal pain as well as hemorrhoidal bleeding for the past 3-4 days. She states that she normally has irregular bowel movements and generally has a bowel movement every other day. She states her last bowel movement was about 2 days ago. She states that she did have a little bit of hemorrhoidal problems with her first pregnancy. She states that she started noticing some problems about 2 days ago. She also had some bleeding. Last evening she bled for over an hour which prompted her to go to the emergency room where she was evaluated. She was sent home with conservative management. She states that she has been doing some sitz baths. She states that she normally drinks 6-8 glasses of water a day. She has been taking stool softeners. She has tried Preparation H and Tucks without any relief. She denies any dyspnea  PMHx, PSHx, SOCHx, FAMHx, ALL reviewed   Review of Systems A point review of systems was performed all systems are negative except for what is mentioned above    Objective:   Physical Exam BP 110/68  Pulse 76  Temp 97 F (36.1 C) (Temporal)  Resp 16  Ht 5\' 4"  (1.626 m)  Wt 188 lb 12.8 oz (85.639 kg)  BMI 32.41 kg/m2  LMP 06/24/2011 Alert, pregnant female who appears uncomfortable Skin-no cellulitis, edema Neuro-nonfocal Rectal-she has a very large thrombosed external hemorrhoid in the right lateral position extending all the way to the anterior midline. She also has a smaller thrombosed hemorrhoid in the left posterior lateral position. Digital rectal exam was deferred.    Assessment:     Multiple thrombosed external hemorrhoids    Plan:     We discussed the etiology of thrombosed external hemorrhoids. Because she is having ongoing pain, I recommended excision and evacuation of  the clot. The hemorrhoids were too large to excise in their entirety. After obtaining verbal consent, the area was prepped with Betadine. 2% Xylocaine with epinephrine was infiltrated into the right lateral hemorrhoid. I then made a 1 inch incision with a #11 blade. I evacuated multiple clots. There was some bleeding. We then turned our attention to the left lateral hemorrhoid and performed the same procedure. The incision was approximately 1 cm. There is a large clot was evacuated from this hemorrhoid. Quarter-inch iodoform gauze was packed into each hemorrhoid.  The patient was given wound care instructions. Advised her to continue with sitz baths. I discussed the importance of continuing with stool softener. I told her to start on MiraLAX on a daily basis for now. She was told to expect some bleeding. Followup when necessary  Mary Sella. Andrey Campanile, MD, FACS General, Bariatric, & Minimally Invasive Surgery [facility], [place]

## 2012-02-27 NOTE — Patient Instructions (Signed)
Drink plenty of fluids Eat a high fiber diet Soak in a warm water tub for 15-20 minutes 4 times a day and before and after a bowel movement Start taking Miralax daily - follow package directions Remove packing strip in AM Expect some bleeding for the next few days Can apply tea bags to hemorrhoid area Take a stool softner Can use the proctofoam Try to minimize narcotics

## 2012-03-03 ENCOUNTER — Ambulatory Visit (INDEPENDENT_AMBULATORY_CARE_PROVIDER_SITE_OTHER): Payer: Private Health Insurance - Indemnity | Admitting: Obstetrics and Gynecology

## 2012-03-03 VITALS — BP 104/60 | Wt 187.0 lb

## 2012-03-03 DIAGNOSIS — O358XX0 Maternal care for other (suspected) fetal abnormality and damage, not applicable or unspecified: Secondary | ICD-10-CM

## 2012-03-03 DIAGNOSIS — Q792 Exomphalos: Secondary | ICD-10-CM

## 2012-03-03 NOTE — Progress Notes (Signed)
[redacted]w[redacted]d The patient has an infant with an omphalocele.  She is scheduled to be delivered in [place].  She has an appointment this Friday with the maternal fetal medicine specialist. Return to office in 1 week he did here or in [place]. Cesarean section discussed. The patient had recent hemorrhoid surgery reports that she is better. Dr. Stefano Gaul

## 2012-03-03 NOTE — Progress Notes (Signed)
[redacted]w[redacted]d Pt states that she is recovering from hemorrhoid surgery. No complaints today.

## 2012-03-09 ENCOUNTER — Encounter: Payer: Private Health Insurance - Indemnity | Admitting: Obstetrics and Gynecology

## 2014-02-27 ENCOUNTER — Encounter (INDEPENDENT_AMBULATORY_CARE_PROVIDER_SITE_OTHER): Payer: Self-pay | Admitting: General Surgery

## 2014-06-09 IMAGING — US US OB DETAIL+14 WK
1 series · 12 of 28 positions shown · non-contrast
Comparison: none

[Series 1: us ob detail+14 wk · 0.21mm/px · 12 of 104 slices shown]
[im 4/104]
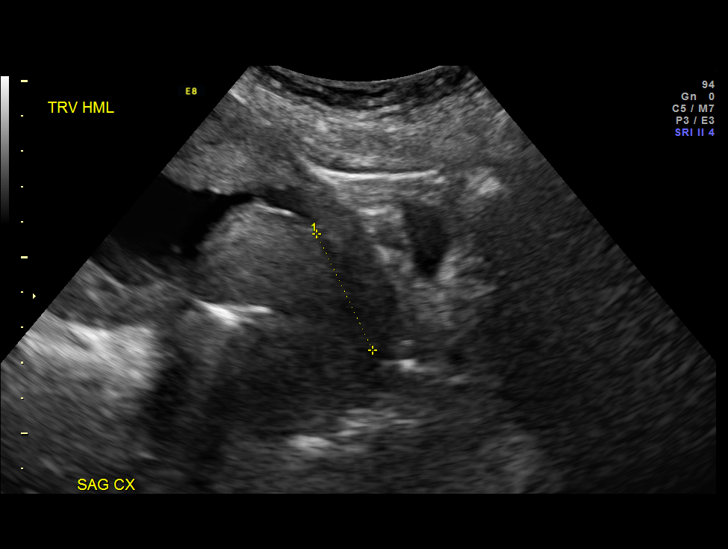
[im 12/104]
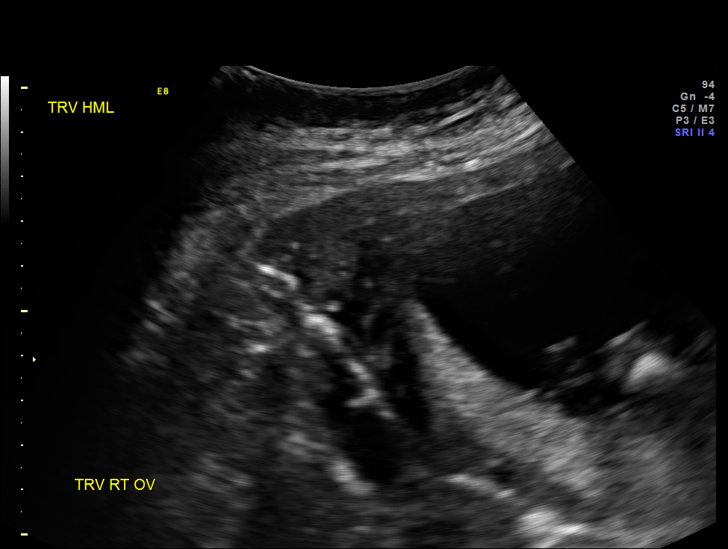
[im 20/104]
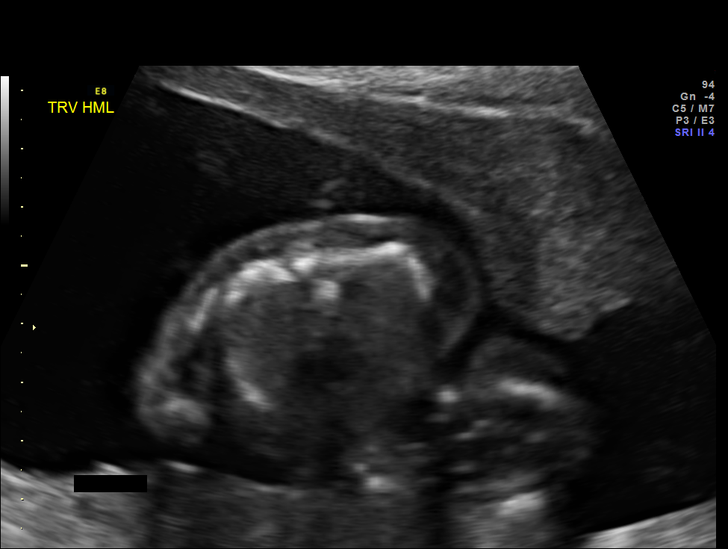
[im 31/104]
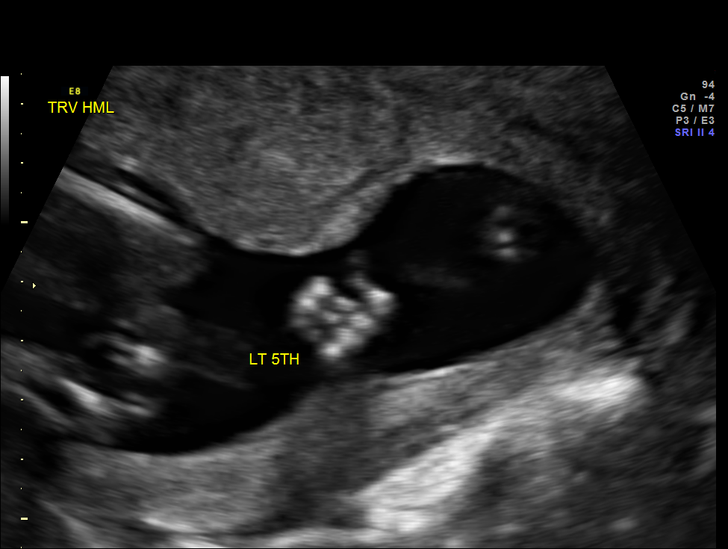
[im 39/104]
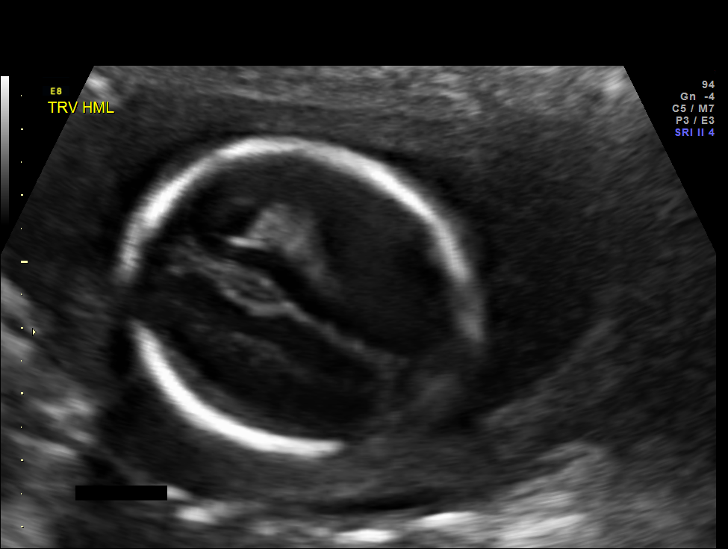
[im 46/104]
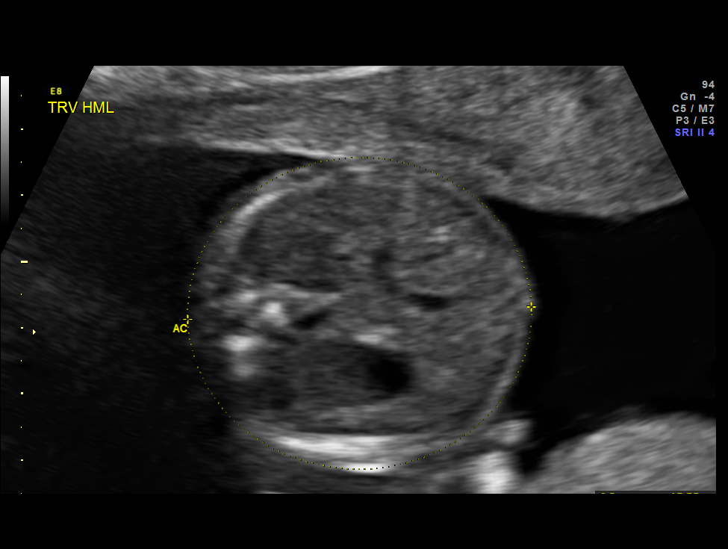
[im 58/104]
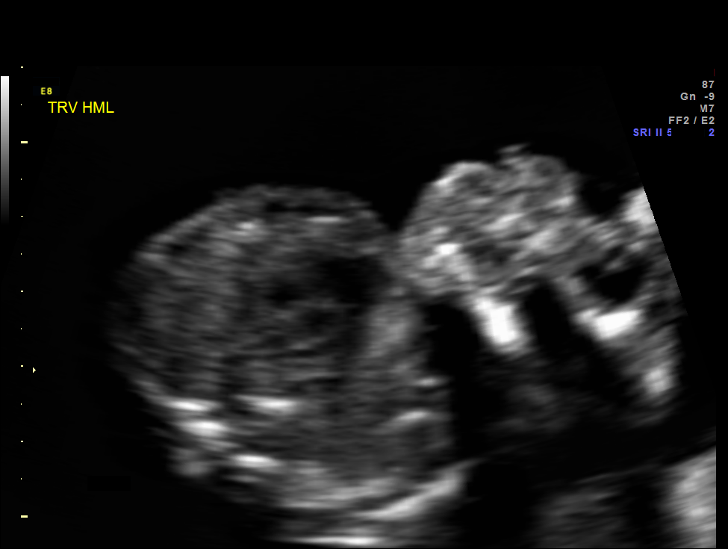
[im 65/104]
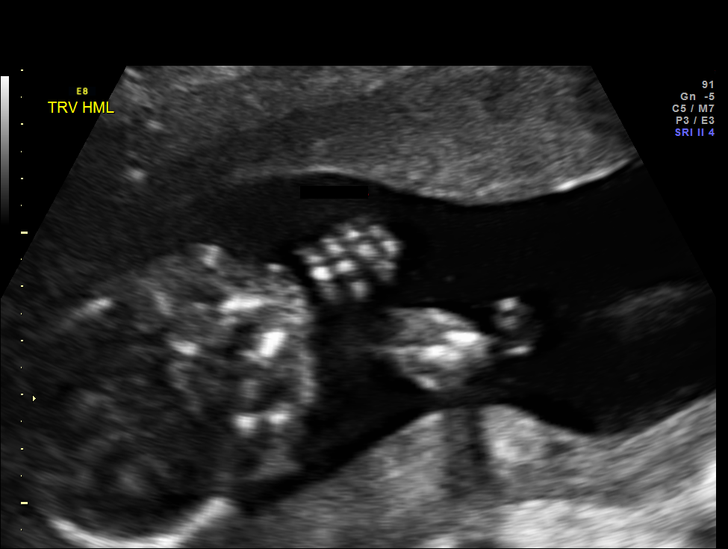
[im 73/104]
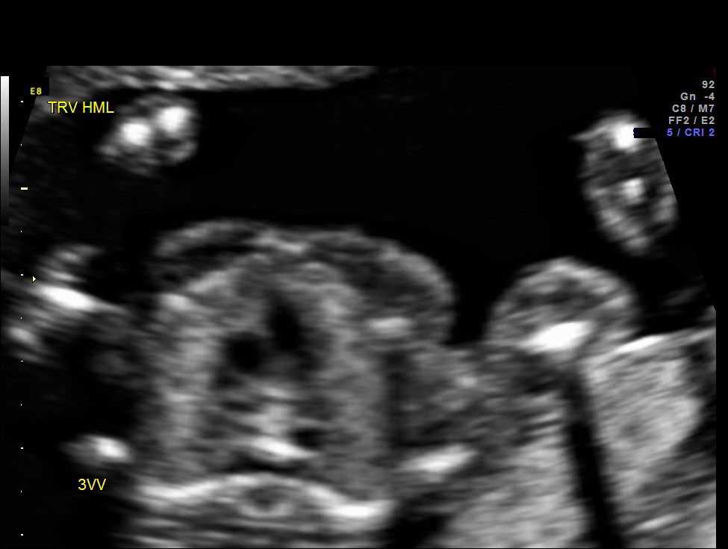
[im 84/104]
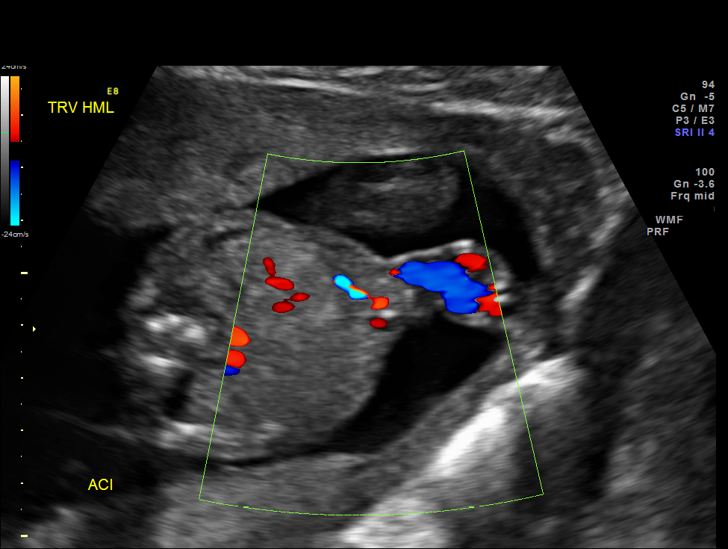
[im 92/104]
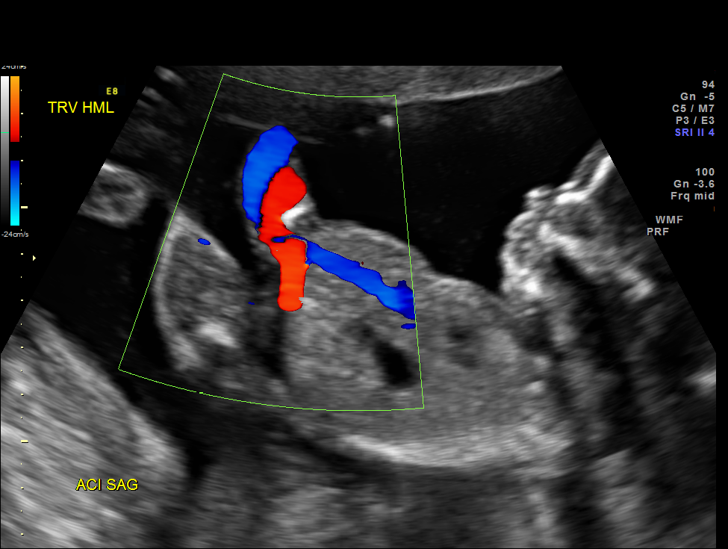
[im 100/104]
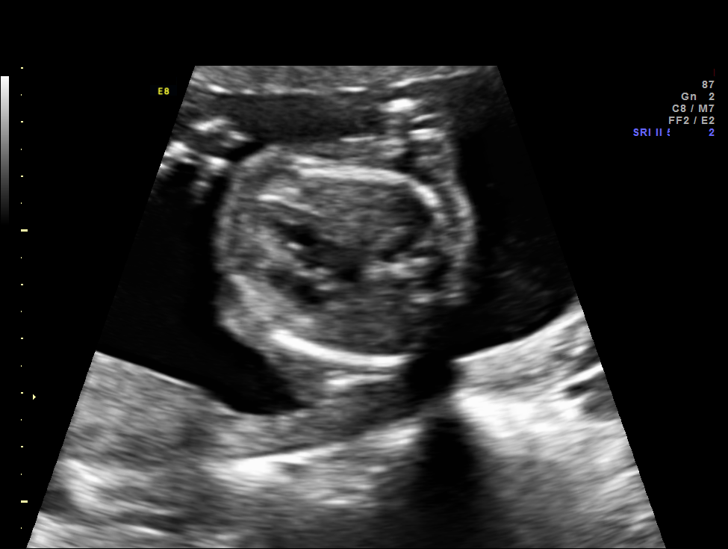

[12 of 28 positions shown; findings below may reference images not displayed]

OBSTETRICS REPORT
                      (Signed Final 11/11/2011 [DATE])

 Order#:         [id number]
Procedures

 US OB DETAIL + 14 WK                                  76811.0
Indications

 Detailed fetal anatomic survey
 Echogenic focus in heart (ECF)
 Omphalocele
 Previous cervical surgery ([place])
 Previous cesarean section
Fetal Evaluation

 Fetal Heart Rate:  130                          bpm
 Cardiac Activity:  Observed
 Presentation:      Transverse, head to
                    maternal left
 Placenta:          Posterior, above cervical
                    os
 P. Cord            Visualized, central
 Insertion:

 Amniotic Fluid
 AFI FV:      Subjectively within normal limits
                                             Larg Pckt:     6.8  cm
Biometry

 BPD:     46.7  mm     G. Age:  20w 1d                CI:         76.2   70 - 86
 OFD:     61.3  mm                                    FL/HC:      17.9   16.8 -

 HC:     173.5  mm     G. Age:  19w 6d       38  %    HC/AC:      1.11   1.09 -

 AC:     156.4  mm     G. Age:  20w 6d       70  %    FL/BPD:
 FL:      31.1  mm     G. Age:  19w 4d       30  %    FL/AC:      19.9   20 - 24
 HUM:     32.2  mm     G. Age:  20w 5d       75  %
 CER:     21.2  mm     G. Age:  20w 1d       53  %

 Est. FW:     340  gm    0 lb 12 oz      53  %
Gestational Age

 LMP:           20w 0d        Date:  06/24/11                 EDD:   03/30/12
 U/S Today:     20w 1d                                        EDD:   03/29/12
 Best:          20w 0d     Det. By:  LMP  (06/24/11)          EDD:   03/30/12
Anatomy

 Cranium:           Appears normal      Aortic Arch:       Appears normal
 Fetal Cavum:       Appears normal      Ductal Arch:       Appears normal
 Ventricles:        Appears normal      Diaphragm:         Appears normal
 Choroid Plexus:    Appears normal      Stomach:           Appears normal
 Cerebellum:        Appears normal      Abdomen:           Appears normal
 Posterior Fossa:   Appears normal      Abdominal Wall:    Omphalocele
 Nuchal Fold:       Not applicable      Cord Vessels:      Appears normal
                    (>20 wks GA)                           (3 vessel cord)
 Face:              Appears normal      Kidneys:           Appear normal
                    (lips/profile/orbit
                    s)
 Heart:             Echogenic           Bladder:           Appears normal
                    focus in LV,
                    NL 4CH
 RVOT:              Appears normal      Spine:             Appears normal
 LVOT:              Appears normal      Limbs:             Appears normal
                                                           (hands, ankles,
                                                           feet)

 Other:     Fetus appears to be a male. Heels and 5th digit
            visualized. Nasal bone visualized.
Targeted Anatomy

 Fetal Central Nervous System
 Cisterna Magna:
Cervix Uterus Adnexa

 Cervical Length:    3.6      cm

 Cervix:       Normal appearance by transabdominal scan.

 Left Ovary:    Within normal limits.
 Right Ovary:   Within normal limits.
 Adnexa:     No abnormality visualized.
Comments

 Ms. Mazzonick is referred due to possible fetal umbilical hernia
 and EIF.  She had a normal first trimester screen.  Ultrasound
 reveals a small omphalocele.  Findings and limitations of the
 exam were discussed.  After counseling, she elected to
 undergo cell free fetal DNA testing.
Impression

 Single IUP at 20 0/7 weeks
 A small omphalocele is noted
 Echogenic intracardiac focus is appreciated in the left
 ventricle
 The remainder of the fetal anatomy is within normal limits.
 Normal amniotic fluid volume
Recommendations

 Recommend follow up in 4 weeks for reevalution.  Fetal echo
 with Peds cardiolgy scheduled.  Will make arrangements to
 meet with Pediatric surgery and will need to deliver at [facility] center with Peds surgery support.

 questions or concerns.

## 2014-07-07 IMAGING — US US OB FOLLOW-UP
1 series · 12 of 28 positions shown · non-contrast
Comparison: none

[Series 1: us ob follow-up · 0.21mm/px · 67 acquisitions, 12 frames shown]
[im 3/67]
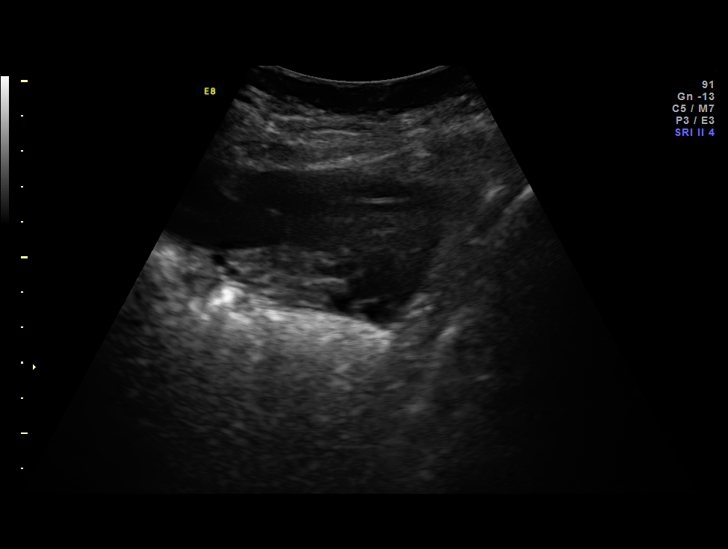
[im 8/67]
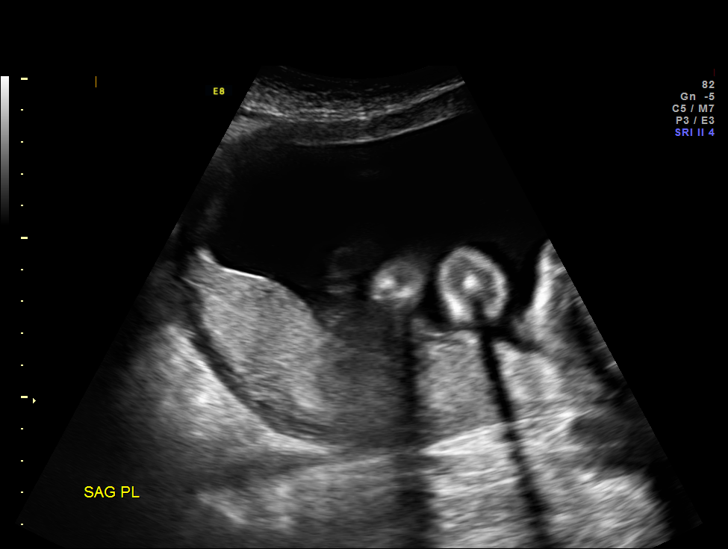
[im 13/67]
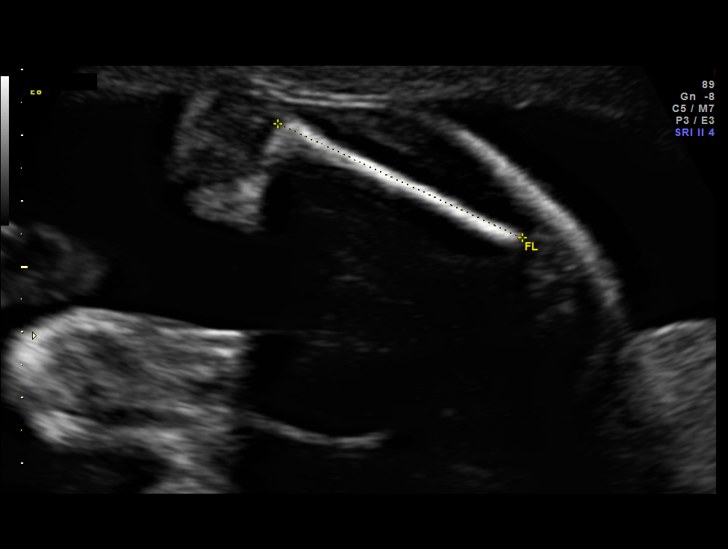
[im 20/67]
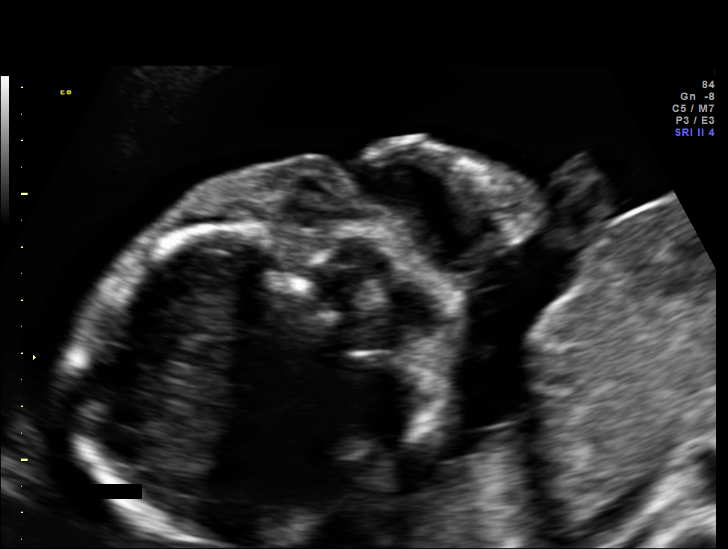
[im 25/67]
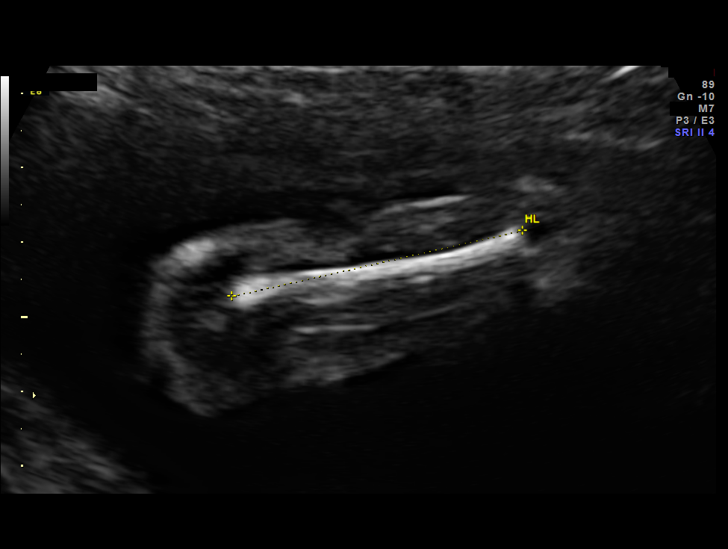
[im 30/67]
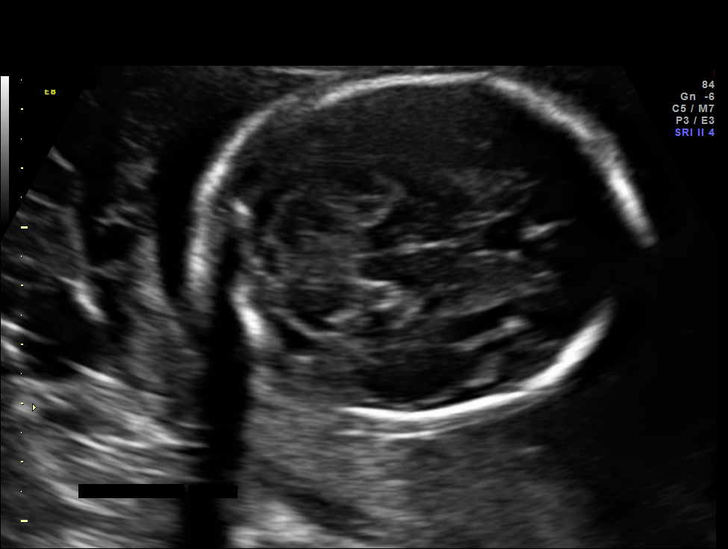
[im 37/67]
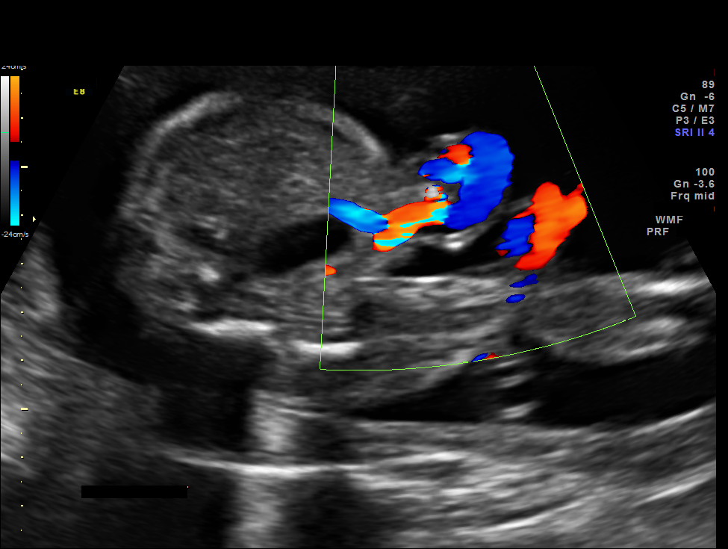
[im 42/67]
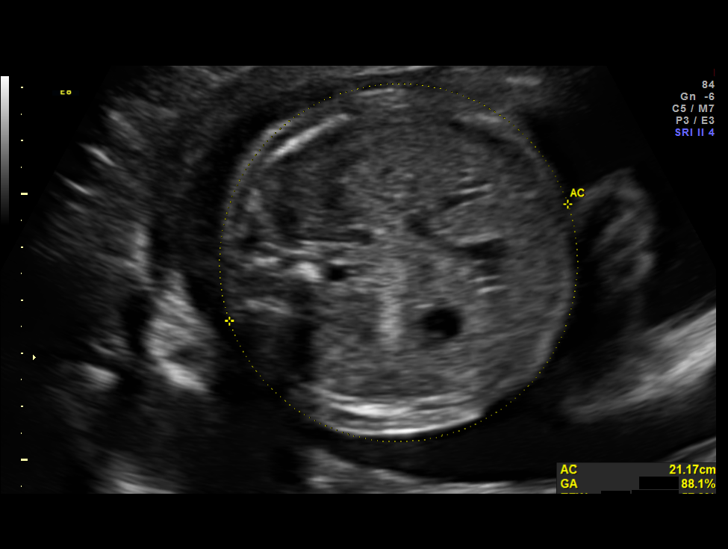
[im 47/67]
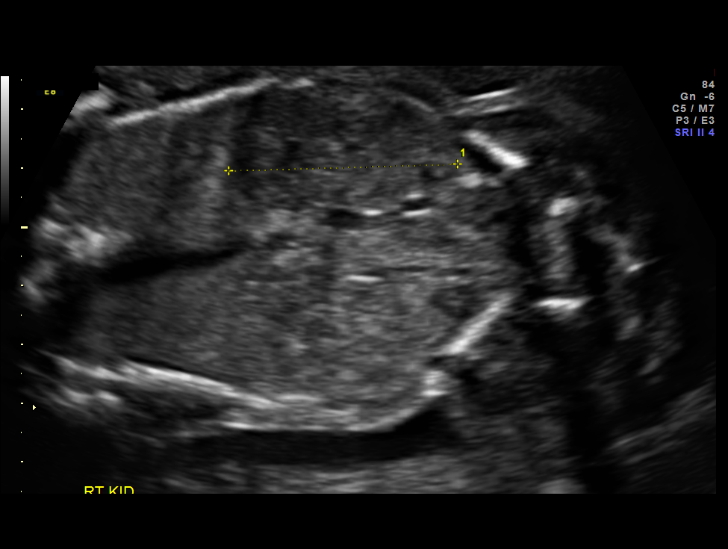
[im 54/67]
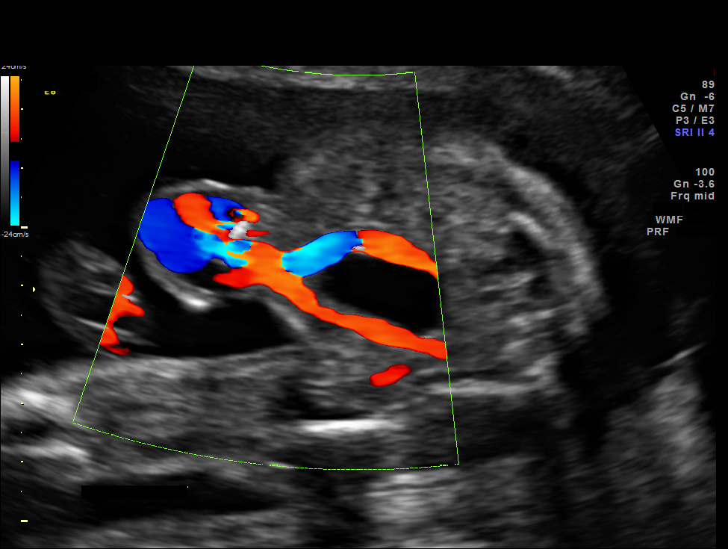
[im 59/67]
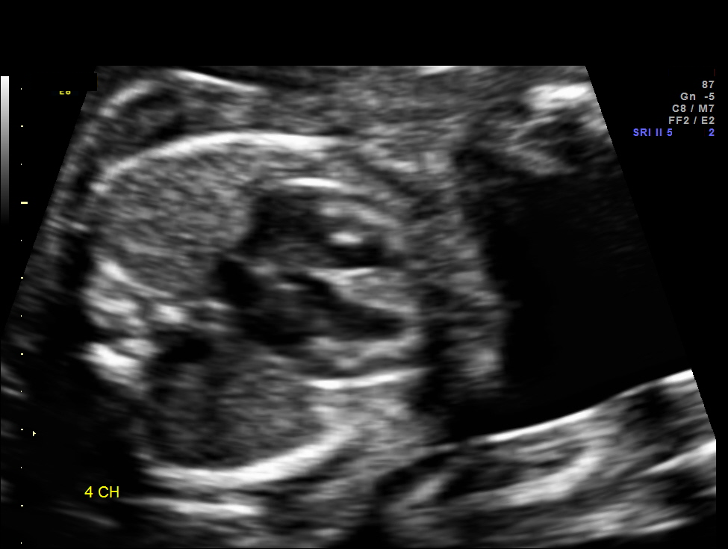
[im 64/67]
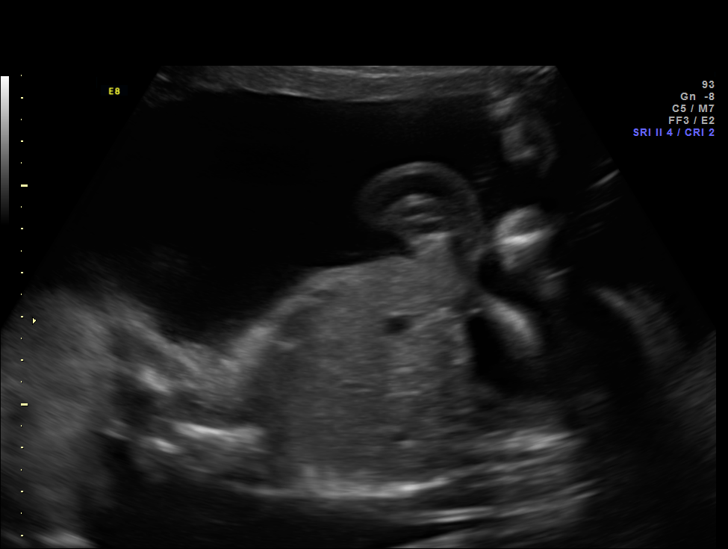

[12 of 28 positions shown; findings below may reference images not displayed]

OBSTETRICS REPORT
                      (Signed Final 12/09/2011 [DATE])

 Order#:         [id number]
Procedures

 US OB FOLLOW UP                                       76816.1
Indications

 Echogenic focus in heart (ECF)
 Omphalocele
 Previous cervical surgery ([place])
 Previous cesarean section
 Assess Fetal Growth / Estimated Fetal Weight
Fetal Evaluation

 Fetal Heart Rate:  141                          bpm
 Cardiac Activity:  Observed
 Presentation:      Breech
 Placenta:          Posterior, above cervical
                    os
 P. Cord            Previously Visualized
 Insertion:

 Amniotic Fluid
 AFI FV:      Subjectively within normal limits
                                             Larg Pckt:     6.0  cm
Biometry

 BPD:     57.6  mm     G. Age:  23w 4d                CI:         73.6   70 - 86
 OFD:     78.3  mm                                    FL/HC:      18.7   18.7 -

 HC:     217.4  mm     G. Age:  23w 5d       25  %    HC/AC:      1.03   1.05 -

 AC:     211.7  mm     G. Age:  25w 5d       87  %    FL/BPD:     70.7   71 - 87
 FL:      40.7  mm     G. Age:  23w 1d       16  %    FL/AC:      19.2   20 - 24
 HUM:     40.7  mm     G. Age:  24w 5d       58  %

 Est. FW:     702  gm      1 lb 9 oz     61  %
Gestational Age

 LMP:           24w 0d        Date:  06/24/11                 EDD:   03/30/12
 U/S Today:     24w 0d                                        EDD:   03/30/12
 Best:          24w 0d     Det. By:  LMP  (06/24/11)          EDD:   03/30/12
Anatomy

 Cranium:           Appears normal      Aortic Arch:       Previously seen
 Fetal Cavum:       Appears normal      Ductal Arch:       Previously seen
 Ventricles:        Appears normal      Diaphragm:         Appears normal
 Choroid Plexus:    Appears normal      Stomach:           Appears
                                                           normal, left
                                                           sided
 Cerebellum:        Appears normal      Abdomen:           Appears normal
 Posterior Fossa:   Appears normal      Abdominal Wall:    Omphalocele
 Nuchal Fold:       Not applicable      Cord Vessels:      Appears normal
                    (>20 wks GA)                           (3 vessel cord)
 Face:              Appears normal      Kidneys:           Appear normal
                    (lips/profile/orbit
                    s)
 Heart:             Appears normal      Bladder:           Appears normal
                    (4 chamber &
                    axis)
 RVOT:              Previously seen     Spine:             Previously seen
 LVOT:              Previously seen     Limbs:             Previously seen

 Other:     Male gender. Heels and 5th digit previously seen.
Cervix Uterus Adnexa

 Cervical Length:    3.4      cm

 Cervix:       Normal appearance by transabdominal scan. Appears
               closed, without funnelling.
 Left Ovary:    Within normal limits.
 Right Ovary:   Within normal limits.
Impression

 IUP at 24+0 weeks
 Very small omphalocele/umbilical hernia; appeared isolated
 All other interval fetal anatomy was seen and appeared
 normal
 Normal amniotic fluid volume
 Appropriate interval growth with EFW at the 61st %tile
Recommendations

 Please see genetic counseling note
 We plan to arrange consultation with a pediatric surgeon
 Serial Metts for growth; we would be pleased to perform these
 exams.  If desired, please call to schedule.

 questions or concerns.

## 2021-12-11 ENCOUNTER — Emergency Department (HOSPITAL_BASED_OUTPATIENT_CLINIC_OR_DEPARTMENT_OTHER): Payer: 59

## 2021-12-11 ENCOUNTER — Encounter (HOSPITAL_BASED_OUTPATIENT_CLINIC_OR_DEPARTMENT_OTHER): Payer: Self-pay | Admitting: Emergency Medicine

## 2021-12-11 ENCOUNTER — Other Ambulatory Visit: Payer: Self-pay

## 2021-12-11 ENCOUNTER — Emergency Department (HOSPITAL_BASED_OUTPATIENT_CLINIC_OR_DEPARTMENT_OTHER)
Admission: EM | Admit: 2021-12-11 | Discharge: 2021-12-11 | Disposition: A | Discharge status: Home / Self Care | Payer: 59 | Attending: Emergency Medicine | Admitting: Emergency Medicine

## 2021-12-11 DIAGNOSIS — R202 Paresthesia of skin: Secondary | ICD-10-CM | POA: Diagnosis not present

## 2021-12-11 DIAGNOSIS — M79604 Pain in right leg: Secondary | ICD-10-CM | POA: Diagnosis present

## 2021-12-11 DIAGNOSIS — M7989 Other specified soft tissue disorders: Secondary | ICD-10-CM | POA: Insufficient documentation

## 2021-12-11 NOTE — ED Triage Notes (Signed)
Patient arrived via POV c/o right leg pain x several weeks, with new onset facial numbness x 12 hrs. Patient states numbness to right face from ear to jaw. Provider brought in for further eval. Patient is AO x 4, VS with elevated BP, normal gait.

## 2021-12-11 NOTE — ED Notes (Signed)
Pt in radiology 

## 2021-12-11 NOTE — ED Provider Notes (Signed)
[facility] EMERGENCY DEPARTMENT Provider Note   CSN: [id number] Arrival date & time: 12/11/21  2037     History  Chief Complaint  Patient presents with   Numbness   Leg Pain    Sharon Mcbride is a 36 y.o. female.  36 yo F with a chief complaints of right leg pain and swelling.  This been going on for about 3 or 4 weeks.  She had a varicose vein removed in about the same area and has scheduled follow-up with the vascular clinic in the office.  Is gotten a little bit worse over the past couple days it is diffusely about the anterior aspect of the right knee.  She denies trauma to the area.  Pain worse with prolonged standing.  She is also complaining of new onset tingling to the right side of her face.  This goes from the ear across the lower aspect of the cheek.  She denies any pain to the ear denies any dental pain.  Denies rash.  She has been having some sinus congestion.  Denies pain to the head or the neck.  She otherwise denies one-sided numbness or weakness denies difficulty speech or swallowing.   Leg Pain      Home Medications Prior to Admission medications   Medication Sig Start Date End Date Taking? Authorizing Provider  fluticasone (FLONASE) 50 MCG/ACT nasal spray Place 2 sprays into the nose daily. 12/04/11 12/03/12  Nigel Bridgeman, CNM  HYDROcodone-acetaminophen (VICODIN) 5-500 MG per tablet Take 1 tablet by mouth every 6 (six) hours as needed for pain. 02/27/12   Sanda Klein, CNM  pramoxine (PROCTOFOAM) 1 % foam Place rectally every 2 (two) hours as needed for hemorrhoids. 02/27/12   Sanda Klein, CNM  Prenatal Vit-Fe Fumarate-FA (PRENATAL MULTIVITAMIN) TABS Take 1 tablet by mouth every morning.    [provider]      Allergies    Patient has no known allergies.    Review of Systems   Review of Systems  Physical Exam Updated Vital Signs BP (!) 158/110 (BP Location: Left Arm)   Pulse 77   Temp 98 F (36.7 C) (Oral)   Resp 18   Ht 5'  4" (1.626 m)   Wt 88 kg   LMP  (LMP Unknown)   SpO2 98%   BMI 33.30 kg/m  Physical Exam Vitals and nursing note reviewed.  Constitutional:      General: She is not in acute distress.    Appearance: She is well-developed. She is not diaphoretic.  HENT:     Head: Normocephalic and atraumatic.  Eyes:     Pupils: Pupils are equal, round, and reactive to light.  Cardiovascular:     Rate and Rhythm: Normal rate and regular rhythm.     Heart sounds: No murmur heard.    No friction rub. No gallop.  Pulmonary:     Effort: Pulmonary effort is normal.     Breath sounds: No wheezing or rales.  Abdominal:     General: There is no distension.     Palpations: Abdomen is soft.     Tenderness: There is no abdominal tenderness.  Musculoskeletal:        General: No tenderness.     Cervical back: Normal range of motion and neck supple.     Comments: Some mild pain about the right knee.  Mild fullness on the right lower extremity compared to the left.  Pulse motor and sensation intact distally.  No obvious  ligamentous laxity negative McMurray's test.  Skin:    General: Skin is warm and dry.  Neurological:     Mental Status: She is alert and oriented to person, place, and time.     Cranial Nerves: Cranial nerves 2-12 are intact.     Sensory: Sensation is intact.     Motor: Motor function is intact.     Coordination: Coordination is intact.     Comments: Benign neurologic exam with the exception of subjective increase sensation to the right side of her face.  No obvious facial asymmetry.  Ambulates without issue.  Psychiatric:        Behavior: Behavior normal.     ED Results / Procedures / Treatments   Labs (all labs ordered are listed, but only abnormal results are displayed) Labs Reviewed - No data to display  EKG None  Radiology US Venous Img Lower Unilateral Right  Result Date: 12/11/2021 CLINICAL DATA:  Swelling and pain. EXAM: RIGHT LOWER EXTREMITY VENOUS DOPPLER ULTRASOUND  TECHNIQUE: Gray-scale sonography with compression, as well as color and duplex ultrasound, were performed to evaluate the deep venous system(s) from the level of the common femoral vein through the popliteal and proximal calf veins. COMPARISON:  None Available. FINDINGS: VENOUS Normal compressibility of the common femoral, superficial femoral, and popliteal veins, as well as the visualized calf veins. Visualized portions of profunda femoral vein and great saphenous vein unremarkable. No filling defects to suggest DVT on grayscale or color Doppler imaging. Doppler waveforms show normal direction of venous flow, normal respiratory plasticity and response to augmentation. Limited views of the contralateral common femoral vein are unremarkable. OTHER None. Limitations: none IMPRESSION: Negative. Electronically Signed   By: Thornell Sartorius M.D.   On: 12/11/2021 23:09   CT Head Wo Contrast  Result Date: 12/11/2021 CLINICAL DATA:  36 year old female with new onset facial numbness EXAM: CT HEAD WITHOUT CONTRAST TECHNIQUE: Contiguous axial images were obtained from the base of the skull through the vertex without intravenous contrast. RADIATION DOSE REDUCTION: This exam was performed according to the departmental dose-optimization program which includes automated exposure control, adjustment of the mA and/or kV according to patient size and/or use of iterative reconstruction technique. COMPARISON:  None Available. FINDINGS: Brain: No intracranial hemorrhage, mass effect, or evidence of acute infarct. No hydrocephalus. No extra-axial fluid collection. Vascular: No hyperdense vessel or unexpected calcification. Skull: No fracture or focal lesion. Sinuses/Orbits: No acute finding. Paranasal sinuses and mastoid air cells are well aerated. Other: None. IMPRESSION: Unremarkable noncontrast CT head. Electronically Signed   By: Minerva Fester M.D.   On: 12/11/2021 22:50    Procedures Procedures    Medications Ordered in  ED Medications - No data to display  ED Course/ Medical Decision Making/ A&P                           Medical Decision Making Amount and/or Complexity of Data Reviewed Radiology: ordered.   36 yo F with a chief complaints of right leg pain and swelling and right-sided facial tingling.  These are separate complaints and have occurred with different timelines.  The right leg has been bothering her for 3 weeks to a month.  In the right side of her face started tingling this morning.  She has a benign neurologic exam with the exception of subjective increase sensation to the right side of her face.  Could be early shingles.  Could be cervical neuropathy.  Seems less likely  to be strokelike CT of the head without obvious intracranial pathology is independently interpreted by me.  We will obtain a DVT study of the right lower extremity.  CT scan of the head negative for intracranial pathology.  DVT study of the right lower extremity all without DVT.  Again I discussed with the patient that this does not fully rule out a stroke, I discussed the possibility of sending her to come in for an MRI which she is declining.  We will have her follow-up with her family doctor in the office.  11:18 PM:  I have discussed the diagnosis/risks/treatment options with the patient and family.  Evaluation and diagnostic testing in the emergency department does not suggest an emergent condition requiring admission or immediate intervention beyond what has been performed at this time.  They will follow up with  PCP, sports med. We also discussed returning to the ED immediately if new or worsening sx occur. We discussed the sx which are most concerning (e.g., sudden worsening pain, fever, inability to tolerate by mouth, stroke s/sx, rash) that necessitate immediate return. Medications administered to the patient during their visit and any new prescriptions provided to the patient are listed below.  Medications given during  this visit Medications - No data to display   The patient appears reasonably screen and/or stabilized for discharge and I doubt any other medical condition or other [facility] requiring further screening, evaluation, or treatment in the ED at this time prior to discharge.          Final Clinical Impression(s) / ED Diagnoses Final diagnoses:  Right leg pain  Paresthesia    Rx / DC Orders ED Discharge Orders     None         Melene Plan, DO 12/11/21 2318

## 2021-12-11 NOTE — Discharge Instructions (Signed)
There is no obvious signs of a stroke on your CT scan of the head.  Your ultrasound of your leg did not show a blood clot in the leg.  Please follow-up with your family doctor in the office.  I have given you information to follow-up with the sports medicine doctor upstairs if he would like him to see your leg as well.  Please return for one-sided numbness or weakness difficulty with speech or swallowing.  If you develop a rash or weakness to the right side of your face this could be something that is called Bell's palsy, people tend to benefit from early administration of steroids and antiviral medicines you might need to come back to the emergency department to be started on those medications or perhaps seen by your family doctor.

## 2023-03-12 ENCOUNTER — Encounter: Payer: Self-pay | Admitting: Cardiology

## 2023-03-12 ENCOUNTER — Other Ambulatory Visit (HOSPITAL_COMMUNITY): Payer: Self-pay

## 2023-03-12 ENCOUNTER — Ambulatory Visit: Payer: BC Managed Care – PPO | Attending: Cardiology | Admitting: Cardiology

## 2023-03-12 VITALS — BP 122/90 | HR 59 | Ht 64.0 in | Wt 201.8 lb

## 2023-03-12 DIAGNOSIS — I10 Essential (primary) hypertension: Secondary | ICD-10-CM

## 2023-03-12 DIAGNOSIS — Z7689 Persons encountering health services in other specified circumstances: Secondary | ICD-10-CM | POA: Diagnosis not present

## 2023-03-12 DIAGNOSIS — I2510 Atherosclerotic heart disease of native coronary artery without angina pectoris: Secondary | ICD-10-CM

## 2023-03-12 DIAGNOSIS — R0609 Other forms of dyspnea: Secondary | ICD-10-CM | POA: Diagnosis not present

## 2023-03-12 DIAGNOSIS — R5383 Other fatigue: Secondary | ICD-10-CM

## 2023-03-12 MED ORDER — BLOOD PRESSURE CUFF MISC
1.0000 [IU] | Freq: Once | 0 refills | Status: AC
  Filled 2023-03-12: qty 1, 30d supply, fill #0

## 2023-03-12 NOTE — Patient Instructions (Addendum)
Medication Instructions:  Your physician recommends that you continue on your current medications as directed. Please refer to the Current Medication list given to you today.  *If you need a refill on your cardiac medications before your next appointment, please call your pharmacy*   Lab Work: Vit D If you have labs (blood work) drawn today and your tests are completely normal, you will receive your results only by: MyChart Message (if you have MyChart) OR A paper copy in the mail If you have any lab test that is abnormal or we need to change your treatment, we will call you to review the results.   Testing/Procedures: Your physician has requested that you have an echocardiogram. Echocardiography is a painless test that uses sound waves to create images of your heart. It provides your doctor with information about the size and shape of your heart and how well your heart's chambers and valves are working. This procedure takes approximately one hour. There are no restrictions for this procedure. Please do NOT wear cologne, perfume, aftershave, or lotions (deodorant is allowed). Please arrive 15 minutes prior to your appointment time.  Please note: We ask at that you not bring children with you during ultrasound (echo/ vascular) testing. Due to room size and safety concerns, children are not allowed in the ultrasound rooms during exams. Our front office staff cannot provide observation of children in our lobby area while testing is being conducted. An adult accompanying a patient to their appointment will only be allowed in the ultrasound room at the discretion of the ultrasound technician under special circumstances. We apologize for any inconvenience.  Dr. Servando Salina has ordered a CT coronary calcium score.   Test locations:  [facility]  [place]  [facility]  This is $99 out of pocket.   Coronary CalciumScan A  coronary calcium scan is an imaging test used to look for deposits of calcium and other fatty materials (plaques) in the inner lining of the blood vessels of the heart (coronary arteries). These deposits of calcium and plaques can partly clog and narrow the coronary arteries without producing any symptoms or warning signs. This puts a person at risk for a heart attack. This test can detect these deposits before symptoms develop. Tell a health care provider about: Any allergies you have. All medicines you are taking, including vitamins, herbs, eye drops, creams, and over-the-counter medicines. Any problems you or family members have had with anesthetic medicines. Any blood disorders you have. Any surgeries you have had. Any medical conditions you have. Whether you are pregnant or may be pregnant. What are the risks? Generally, this is a safe procedure. However, problems may occur, including: Harm to a pregnant woman and her unborn baby. This test involves the use of radiation. Radiation exposure can be dangerous to a pregnant woman and her unborn baby. If you are pregnant, you generally should not have this procedure done. Slight increase in the risk of cancer. This is because of the radiation involved in the test. What happens before the procedure? No preparation is needed for this procedure. What happens during the procedure? You will undress and remove any jewelry around your neck or chest. You will put on a hospital gown. Sticky electrodes will be placed on your chest. The electrodes will be connected to an electrocardiogram (ECG) machine to record a tracing of the electrical activity of your heart. A CT scanner will take pictures of your heart. During  this time, you will be asked to lie still and hold your breath for 2-3 seconds while a picture of your heart is being taken. The procedure may vary among health care providers and hospitals. What happens after the procedure? You can get  dressed. You can return to your normal activities. It is up to you to get the results of your test. Ask your health care provider, or the department that is doing the test, when your results will be ready. Summary A coronary calcium scan is an imaging test used to look for deposits of calcium and other fatty materials (plaques) in the inner lining of the blood vessels of the heart (coronary arteries). Generally, this is a safe procedure. Tell your health care provider if you are pregnant or may be pregnant. No preparation is needed for this procedure. A CT scanner will take pictures of your heart. You can return to your normal activities after the scan is done. This information is not intended to replace advice given to you by your health care provider. Make sure you discuss any questions you have with your health care provider. Document Released: 10/11/2007 Document Revised: 03/03/2016 Document Reviewed: 03/03/2016 Elsevier Interactive Patient Education  2017 [employer].    Follow-Up: At [facility], you and your health needs are our priority.  As part of our continuing mission to provide you with exceptional heart care, we have created designated Provider Care Teams.  These Care Teams include your primary Cardiologist (physician) and Advanced Practice Providers (APPs -  Physician Assistants and Nurse Practitioners) who all work together to provide you with the care you need, when you need it.  We recommend signing up for the patient portal called "MyChart".  Sign up information is provided on this After Visit Summary.  MyChart is used to connect with patients for Virtual Visits (Telemedicine).  Patients are able to view lab/test results, encounter notes, upcoming appointments, etc.  Non-urgent messages can be sent to your provider as well.   To learn more about what you can do with MyChart, go to [email or web address].    Your next appointment:   6 month(s)  Provider:    Thomasene Ripple, DO   Other Instructions You are invited to attend: [facility] event on February 1st at [facility] [place], [place], [place] [address]) from 8am-12pm. Feel free to invite other women to attend!  See you there!    Diabetes Mellitus and Nutrition, Adult When you have diabetes, or diabetes mellitus, it is very important to have healthy eating habits because your blood sugar (glucose) levels are greatly affected by what you eat and drink. Eating healthy foods in the right amounts, at about the same times every day, can help you: Manage your blood glucose. Lower your risk of heart disease. Improve your blood pressure. Reach or maintain a healthy weight. What can affect my meal plan? Every person with diabetes is different, and each person has different needs for a meal plan. Your health care provider may recommend that you work with a dietitian to make a meal plan that is best for you. Your meal plan may vary depending on factors such as: The calories you need. The medicines you take. Your weight. Your blood glucose, blood pressure, and cholesterol levels. Your activity level. Other health conditions you have, such as heart or kidney disease. How do carbohydrates affect me? Carbohydrates, also called carbs, affect your blood glucose level more than any other type of food. Eating  carbs raises the amount of glucose in your blood. It is important to know how many carbs you can safely have in each meal. This is different for every person. Your dietitian can help you calculate how many carbs you should have at each meal and for each snack. How does alcohol affect me? Alcohol can cause a decrease in blood glucose (hypoglycemia), especially if you use insulin or take certain diabetes medicines by mouth. Hypoglycemia can be a life-threatening condition. Symptoms of hypoglycemia, such as sleepiness, dizziness, and confusion, are similar to symptoms of having too much  alcohol. Do not drink alcohol if: Your health care provider tells you not to drink. You are pregnant, may be pregnant, or are planning to become pregnant. If you drink alcohol: Limit how much you have to: 0-1 drink a day for women. 0-2 drinks a day for men. Know how much alcohol is in your drink. In the [place] one drink equals one 12 oz bottle of beer (355 mL), one 5 oz glass of wine (148 mL), or one 1 oz glass of hard liquor (44 mL). Keep yourself hydrated with water, diet soda, or unsweetened iced tea. Keep in mind that regular soda, juice, and other mixers may contain a lot of sugar and must be counted as carbs. What are tips for following this plan?  Reading food labels Start by checking the serving size on the Nutrition Facts label of packaged foods and drinks. The number of calories and the amount of carbs, fats, and other nutrients listed on the label are based on one serving of the item. Many items contain more than one serving per package. Check the total grams (g) of carbs in one serving. Check the number of grams of saturated fats and trans fats in one serving. Choose foods that have a low amount or none of these fats. Check the number of milligrams (mg) of salt (sodium) in one serving. Most people should limit total sodium intake to less than 2,300 mg per day. Always check the nutrition information of foods labeled as "low-fat" or "nonfat." These foods may be higher in added sugar or refined carbs and should be avoided. Talk to your dietitian to identify your daily goals for nutrients listed on the label. Shopping Avoid buying canned, pre-made, or processed foods. These foods tend to be high in fat, sodium, and added sugar. Shop around the outside edge of the grocery store. This is where you will most often find fresh fruits and vegetables, bulk grains, fresh meats, and fresh dairy products. Cooking Use low-heat cooking methods, such as baking, instead of high-heat cooking methods,  such as deep frying. Cook using healthy oils, such as olive, canola, or sunflower oil. Avoid cooking with butter, cream, or high-fat meats. Meal planning Eat meals and snacks regularly, preferably at the same times every day. Avoid going long periods of time without eating. Eat foods that are high in fiber, such as fresh fruits, vegetables, beans, and whole grains. Eat 4-6 oz (112-168 g) of lean protein each day, such as lean meat, chicken, fish, eggs, or tofu. One ounce (oz) (28 g) of lean protein is equal to: 1 oz (28 g) of meat, chicken, or fish. 1 egg.  cup (62 g) of tofu. Eat some foods each day that contain healthy fats, such as avocado, nuts, seeds, and fish. What foods should I eat? Fruits Berries. Apples. Oranges. Peaches. Apricots. Plums. Grapes. Mangoes. Papayas. Pomegranates. Kiwi. Cherries. Vegetables Leafy greens, including lettuce, spinach, kale, chard, collard  greens, mustard greens, and cabbage. Beets. Cauliflower. Broccoli. Carrots. Green beans. Tomatoes. Peppers. Onions. Cucumbers. Brussels sprouts. Grains Whole grains, such as whole-wheat or whole-grain bread, crackers, tortillas, cereal, and pasta. Unsweetened oatmeal. Quinoa. Brown or wild rice. Meats and other proteins Seafood. Poultry without skin. Lean cuts of poultry and beef. Tofu. Nuts. Seeds. Dairy Low-fat or fat-free dairy products such as milk, yogurt, and cheese. The items listed above may not be a complete list of foods and beverages you can eat and drink. Contact a dietitian for more information. What foods should I avoid? Fruits Fruits canned with syrup. Vegetables Canned vegetables. Frozen vegetables with butter or cream sauce. Grains Refined white flour and flour products such as bread, pasta, snack foods, and cereals. Avoid all processed foods. Meats and other proteins Fatty cuts of meat. Poultry with skin. Breaded or fried meats. Processed meat. Avoid saturated fats. Dairy Full-fat yogurt,  cheese, or milk. Beverages Sweetened drinks, such as soda or iced tea. The items listed above may not be a complete list of foods and beverages you should avoid. Contact a dietitian for more information. Questions to ask a health care provider Do I need to meet with a certified diabetes care and education specialist? Do I need to meet with a dietitian? What number can I call if I have questions? When are the best times to check my blood glucose? Where to find more information: American Diabetes Association: diabetes.org Academy of Nutrition and Dietetics: eatright.[employer] of Diabetes and Digestive and Kidney Diseases: StageSync.si Association of Diabetes Care & Education Specialists: diabeteseducator.org Summary It is important to have healthy eating habits because your blood sugar (glucose) levels are greatly affected by what you eat and drink. It is important to use alcohol carefully. A healthy meal plan will help you manage your blood glucose and lower your risk of heart disease. Your health care provider may recommend that you work with a dietitian to make a meal plan that is best for you. This information is not intended to replace advice given to you by your health care provider. Make sure you discuss any questions you have with your health care provider. Document Revised: 11/16/2019 Document Reviewed: 11/16/2019 Elsevier Patient Education  2024 Elsevier Inc.  Mediterranean Diet A Mediterranean diet is based on the traditions of countries on the [employer]. It focuses on eating more: Fruits and vegetables. Whole grains, beans, nuts, and seeds. Heart-healthy fats. These are fats that are good for your heart. It involves eating less: Dairy. Meat and eggs. Processed foods with added sugar, salt, and fat. This type of diet can help prevent certain conditions. It can also improve outcomes if you have a long-term (chronic) disease, such as kidney or heart  disease. What are tips for following this plan? Reading food labels Check packaged foods for: The serving size. For foods such as rice and pasta, the serving size is the amount of cooked product, not dry. The total fat. Avoid foods with saturated fat or trans fat. Added sugars, such as corn syrup. Shopping  Try to have a balanced diet. Buy a variety of foods, such as: Fresh fruits and vegetables. You may be able to get these from local farmers markets. You can also buy them frozen. Grains, beans, nuts, and seeds. Some of these can be bought in bulk. Fresh seafood. Poultry and eggs. Low-fat dairy products. Buy whole ingredients instead of foods that have already been packaged. If you can't get fresh seafood, buy precooked frozen shrimp or  canned fish, such as tuna, salmon, or sardines. Stock your pantry so you always have certain foods on hand, such as olive oil, canned tuna, canned tomatoes, rice, pasta, and beans. Cooking Cook foods with extra-virgin olive oil instead of using butter or other vegetable oils. Have meat as a side dish. Have vegetables or grains as your main dish. This means having meat in small portions or adding small amounts of meat to foods like pasta or stew. Use beans or vegetables instead of meat in common dishes like chili or lasagna. Try out different cooking methods. Try roasting, broiling, steaming, and sauting vegetables. Add frozen vegetables to soups, stews, pasta, or rice. Add nuts or seeds for added healthy fats and plant protein at each meal. You can add these to yogurt, salads, or vegetable dishes. Marinate fish or vegetables using olive oil, lemon juice, garlic, and fresh herbs. Meal planning Plan to eat a vegetarian meal one day each week. Try to work up to two vegetarian meals, if possible. Eat seafood two or more times a week. Have healthy snacks on hand. These may include: Vegetable sticks with hummus. Greek yogurt. Fruit and nut trail mix. Eat  balanced meals. These should include: Fruit: 2-3 servings a day. Vegetables: 4-5 servings a day. Low-fat dairy: 2 servings a day. Fish, poultry, or lean meat: 1 serving a day. Beans and legumes: 2 or more servings a week. Nuts and seeds: 1-2 servings a day. Whole grains: 6-8 servings a day. Extra-virgin olive oil: 3-4 servings a day. Limit red meat and sweets to just a few servings a month. Lifestyle  Try to cook and eat meals with your family. Drink enough fluid to keep your pee (urine) pale yellow. Be active every day. This includes: Aerobic exercise, which is exercise that causes your heart to beat faster. Examples include running and swimming. Leisure activities like gardening, walking, or housework. Get 7-8 hours of sleep each night. Drink red wine if your provider says you can. A glass of wine is 5 oz (150 mL). You may be allowed to have: Up to 1 glass a day if you're female and not pregnant. Up to 2 glasses a day if you're female. What foods should I eat? Fruits Apples. Apricots. Avocado. Berries. Bananas. Cherries. Dates. Figs. Grapes. Lemons. Melon. Oranges. Peaches. Plums. Pomegranate. Vegetables Artichokes. Beets. Broccoli. Cabbage. Carrots. Eggplant. Green beans. Chard. Kale. Spinach. Onions. Leeks. Peas. Squash. Tomatoes. Peppers. Radishes. Grains Whole-grain pasta. Brown rice. Bulgur wheat. Polenta. Couscous. Whole-wheat bread. Orpah Cobb. Meats and other proteins Beans. Almonds. Sunflower seeds. Pine nuts. Peanuts. Cod. Salmon. Scallops. Shrimp. Tuna. Tilapia. Clams. Oysters. Eggs. Chicken or [place] without skin. Dairy Low-fat milk. Cheese. Greek yogurt. Fats and oils Extra-virgin olive oil. Avocado oil. Grapeseed oil. Beverages Water. Red wine. Herbal tea. Sweets and desserts Greek yogurt with honey. Baked apples. Poached pears. Trail mix. Seasonings and condiments Basil. Cilantro. Coriander. Cumin. Mint. Parsley. Sage. Rosemary. Tarragon. Garlic. Oregano.  Thyme. Pepper. Balsamic vinegar. Tahini. Hummus. Tomato sauce. Olives. Mushrooms. The items listed above may not be all the foods and drinks you can have. Talk to a dietitian to learn more. What foods should I limit? This is a list of foods that should be eaten rarely. Fruits Fruit canned in syrup. Vegetables Deep-fried potatoes, like [place] fries. Grains Packaged pasta or rice dishes. Cereal with added sugar. Snacks with added sugar. Meats and other proteins Beef. Pork. Lamb. Chicken or [place] with skin. Hot dogs. Tomasa Blase. Dairy Ice cream. Sour cream. Whole milk. Fats  and oils Butter. Canola oil. Vegetable oil. Beef fat (tallow). Lard. Beverages Juice. Sugar-sweetened soft drinks. Beer. Liquor and spirits. Sweets and desserts Cookies. Cakes. Pies. Candy. Seasonings and condiments Mayonnaise. Pre-made sauces and marinades. The items listed above may not be all the foods and drinks you should limit. Talk to a dietitian to learn more. Where to find more information American Heart Association (AHA): heart.org This information is not intended to replace advice given to you by your health care provider. Make sure you discuss any questions you have with your health care provider. Document Revised: 07/27/2022 Document Reviewed: 07/27/2022 Elsevier Patient Education  2024 [employer].

## 2023-03-12 NOTE — Progress Notes (Signed)
Cardiology Office Note:    Date:  03/12/2023   ID:  Sharon Mcbride, DOB June 04, 1985, MRN [id number]  PCP:  Caffie Damme, MD  Cardiologist:  None  Electrophysiologist:  None   Referring MD: Kirkland Hun, MD   " I am ready to get into the healthy lifestyle"  History of Present Illness:    Sharon Mcbride is a 37 y.o. female with a hx of with a history of high blood pressure, premature family history of heart disease in her father and diabetes, presents with concerns about her health. She has been experiencing some shortness of breath and fatigue. She is currently on medication for both conditions, including Olmesartan/hydrochlorothiazide for high blood pressure and Ozempic for diabetes. She reports some improvement in her symptoms since starting these medications.  The patient has a strong family history of stroke, high blood pressure, and diabetes, which has motivated her to take her health more seriously. She is committed to improving her diet and exercise habits, and has already lost twelve pounds. She is also a vegetarian and is mindful of her diet.  The patient has not been regularly seeing a primary care doctor, but has recently started seeing one. She had blood work done earlier this year, which showed good cholesterol levels but high blood pressure and diabetes. She was not aware of her prediabetic status until it was pointed out to her during a visit to a [facility].   Past Medical History:  Diagnosis Date   Abnormal Pap smear 2004   colpo.,2004; inflammation, 2006   Allergy    seasonal   Anemia 06/12/2008   H/O candidiasis    H/O hemorrhoids 12/03/10   H/O varicella    History of abnormal Pap smear 10/03/2011   colpo 2004   History of hepatitis B    vaccine   History of induced abortion 10/03/2011   EAB 2008 at 12 weeks   Trichomonas    Varicose veins of legs    HAD TX SCHED WITH SPECIALISTS PRIOR TO +UPT   Yeast infection     Past Surgical History:  Procedure  Laterality Date   CESAREAN SECTION     primary low -transverse   WISDOM TOOTH EXTRACTION     ONLY 2 TEETH SO FAR    Current Medications: Current Meds  Medication Sig   Blood Pressure Monitoring (BLOOD PRESSURE CUFF) MISC 1 Units by Does not apply route once for 1 dose.   Multiple Vitamin (MULTIVITAMIN ADULT PO) Take by mouth daily.   olmesartan-hydrochlorothiazide (BENICAR HCT) 20-12.5 MG tablet Take 1 tablet by mouth daily.   OZEMPIC, 0.25 OR 0.5 MG/DOSE, 2 MG/3ML SOPN once a week.   [DISCONTINUED] HYDROcodone-acetaminophen (VICODIN) 5-500 MG per tablet Take 1 tablet by mouth every 6 (six) hours as needed for pain.     Allergies:   Patient has no known allergies.   Social History   Socioeconomic History   Marital status: Single    Spouse name: Sharon Mcbride   Number of children: 1   Years of education: 16   Highest education level: Not on file  Occupational History   Occupation: MANAGEMENT    Employer: [employer]  Tobacco Use   Smoking status: Never   Smokeless tobacco: Never  Vaping Use   Vaping status: Never Used  Substance and Sexual Activity   Alcohol use: No   Drug use: No   Sexual activity: Yes    Partners: Male    Birth control/protection: None  Comment: currently pregnant  Other Topics Concern   Not on file  Social History Narrative   Not on file   Social Determinants of Health   Financial Resource Strain: Not on file  Food Insecurity: Not on file  Transportation Needs: Not on file  Physical Activity: Not on file  Stress: Not on file  Social Connections: Not on file     Family History: The patient's family history includes Alcohol abuse in her maternal uncle; Anemia in her mother; Cancer in her paternal grandmother; Diabetes in her maternal grandfather and maternal grandmother; Drug abuse in her maternal uncle; Hypertension in her maternal grandmother; Kidney disease in her cousin and maternal uncle; Mental retardation in her paternal uncle.  There is no history of Anesthesia problems.  ROS:   Review of Systems  Constitution: Negative for decreased appetite, fever and weight gain.  HENT: Negative for congestion, ear discharge, hoarse voice and sore throat.   Eyes: Negative for discharge, redness, vision loss in right eye and visual halos.  Cardiovascular: Negative for chest pain, dyspnea on exertion, leg swelling, orthopnea and palpitations.  Respiratory: Negative for cough, hemoptysis, shortness of breath and snoring.   Endocrine: Negative for heat intolerance and polyphagia.  Hematologic/Lymphatic: Negative for bleeding problem. Does not bruise/bleed easily.  Skin: Negative for flushing, nail changes, rash and suspicious lesions.  Musculoskeletal: Negative for arthritis, joint pain, muscle cramps, myalgias, neck pain and stiffness.  Gastrointestinal: Negative for abdominal pain, bowel incontinence, diarrhea and excessive appetite.  Genitourinary: Negative for decreased libido, genital sores and incomplete emptying.  Neurological: Negative for brief paralysis, focal weakness, headaches and loss of balance.  Psychiatric/Behavioral: Negative for altered mental status, depression and suicidal ideas.  Allergic/Immunologic: Negative for HIV exposure and persistent infections.    EKGs/Labs/Other Studies Reviewed:    The following studies were reviewed today:   EKG:  The ekg ordered today demonstrates   Recent Labs: No results found for requested labs within last 365 days.  Recent Lipid Panel No results found for: "CHOL", "TRIG", "HDL", "CHOLHDL", "VLDL", "LDLCALC", "LDLDIRECT"  Physical Exam:    VS:  BP (!) 122/90 (BP Location: Right Arm, Patient Position: Sitting, Cuff Size: Normal)   Pulse (!) 59   Ht 5\' 4"  (1.626 m)   Wt 201 lb 12.8 oz (91.5 kg)   SpO2 96%   BMI 34.64 kg/m     Wt Readings from Last 3 Encounters:  03/12/23 201 lb 12.8 oz (91.5 kg)  12/11/21 194 lb 0.1 oz (88 kg)  03/03/12 187 lb (84.8 kg)      GEN: Well nourished, well developed in no acute distress HEENT: Normal NECK: No JVD; No carotid bruits LYMPHATICS: No lymphadenopathy CARDIAC: S1S2 noted,RRR, no murmurs, rubs, gallops RESPIRATORY:  Clear to auscultation without rales, wheezing or rhonchi  ABDOMEN: Soft, non-tender, non-distended, +bowel sounds, no guarding. EXTREMITIES: No edema, No cyanosis, no clubbing MUSCULOSKELETAL:  No deformity  SKIN: Warm and dry NEUROLOGIC:  Alert and oriented x 3, non-focal PSYCHIATRIC:  Normal affect, good insight  ASSESSMENT:    1. Encounter to establish care   2. Hypertension, unspecified type   3. DOE (dyspnea on exertion)   4. Fatigue, unspecified type   5. ASCVD (arteriosclerotic cardiovascular disease)    PLAN:    Hypertension On Olmesartan HCTZ 20mg /12.5mg  daily. Blood pressure at visit was 122/90. Family history of stroke. EKG shows left ventricular hypertrophy, suggesting long-term uncontrolled hypertension. -Continue Olmesartan/hydrochlorothiazide 20mg /12.5mg  daily. -Order home blood pressure cuff and instruct patient to monitor blood  pressure once or twice daily. -Order echocardiogram to assess for possible cardiac damage from hypertension.  Type 2 Diabetes Mellitus On Ozempic. Recent weight loss of 12 pounds. HbA1c was 6.4 in July 2023. -Continue Ozempic. -Encourage continued dietary changes and exercise. She plans to send me her recent lab which was done by her PCP showing hemoglobin A1c is consistent with diabetes.  Fatigue Possible sleep apnea or vitamin D deficiency. -Consider sleep apnea evaluation if symptoms progress. -Order vitamin D level.  For coronary artery disease Family history of stroke. No personal history of cardiovascular disease. -Order coronary CT scan for calcium scoring.  General Health Maintenance -Continue regular follow-up with primary care physician. -Schedule follow-up in 6 months to reassess blood pressure control, diabetes  management, and overall health status.  The patient is in agreement with the above plan. The patient left the office in stable condition.  The patient will follow up in   Medication Adjustments/Labs and Tests Ordered: Current medicines are reviewed at length with the patient today.  Concerns regarding medicines are outlined above.  Orders Placed This Encounter  Procedures   CT CARDIAC SCORING (SELF PAY ONLY)   VITAMIN D 25 Hydroxy (Vit-D Deficiency, Fractures)   EKG 12-Lead   ECHOCARDIOGRAM COMPLETE   Meds ordered this encounter  Medications   Blood Pressure Monitoring (BLOOD PRESSURE CUFF) MISC    Sig: 1 Units by Does not apply route once for 1 dose.    Dispense:  1 each    Refill:  0    Patient Instructions  Medication Instructions:  Your physician recommends that you continue on your current medications as directed. Please refer to the Current Medication list given to you today.  *If you need a refill on your cardiac medications before your next appointment, please call your pharmacy*   Lab Work: Vit D If you have labs (blood work) drawn today and your tests are completely normal, you will receive your results only by: MyChart Message (if you have MyChart) OR A paper copy in the mail If you have any lab test that is abnormal or we need to change your treatment, we will call you to review the results.   Testing/Procedures: Your physician has requested that you have an echocardiogram. Echocardiography is a painless test that uses sound waves to create images of your heart. It provides your doctor with information about the size and shape of your heart and how well your heart's chambers and valves are working. This procedure takes approximately one hour. There are no restrictions for this procedure. Please do NOT wear cologne, perfume, aftershave, or lotions (deodorant is allowed). Please arrive 15 minutes prior to your appointment time.  Please note: We ask at that you not  bring children with you during ultrasound (echo/ vascular) testing. Due to room size and safety concerns, children are not allowed in the ultrasound rooms during exams. Our front office staff cannot provide observation of children in our lobby area while testing is being conducted. An adult accompanying a patient to their appointment will only be allowed in the ultrasound room at the discretion of the ultrasound technician under special circumstances. We apologize for any inconvenience.  Dr. Servando Salina has ordered a CT coronary calcium score.   Test locations:  [facility]  [facility]  This is $99 out of pocket.   Coronary CalciumScan A coronary calcium scan is an imaging test used to look for deposits of  calcium and other fatty materials (plaques) in the inner lining of the blood vessels of the heart (coronary arteries). These deposits of calcium and plaques can partly clog and narrow the coronary arteries without producing any symptoms or warning signs. This puts a person at risk for a heart attack. This test can detect these deposits before symptoms develop. Tell a health care provider about: Any allergies you have. All medicines you are taking, including vitamins, herbs, eye drops, creams, and over-the-counter medicines. Any problems you or family members have had with anesthetic medicines. Any blood disorders you have. Any surgeries you have had. Any medical conditions you have. Whether you are pregnant or may be pregnant. What are the risks? Generally, this is a safe procedure. However, problems may occur, including: Harm to a pregnant woman and her unborn baby. This test involves the use of radiation. Radiation exposure can be dangerous to a pregnant woman and her unborn baby. If you are pregnant, you generally should not have this procedure done. Slight increase in the risk of cancer. This is  because of the radiation involved in the test. What happens before the procedure? No preparation is needed for this procedure. What happens during the procedure? You will undress and remove any jewelry around your neck or chest. You will put on a hospital gown. Sticky electrodes will be placed on your chest. The electrodes will be connected to an electrocardiogram (ECG) machine to record a tracing of the electrical activity of your heart. A CT scanner will take pictures of your heart. During this time, you will be asked to lie still and hold your breath for 2-3 seconds while a picture of your heart is being taken. The procedure may vary among health care providers and hospitals. What happens after the procedure? You can get dressed. You can return to your normal activities. It is up to you to get the results of your test. Ask your health care provider, or the department that is doing the test, when your results will be ready. Summary A coronary calcium scan is an imaging test used to look for deposits of calcium and other fatty materials (plaques) in the inner lining of the blood vessels of the heart (coronary arteries). Generally, this is a safe procedure. Tell your health care provider if you are pregnant or may be pregnant. No preparation is needed for this procedure. A CT scanner will take pictures of your heart. You can return to your normal activities after the scan is done. This information is not intended to replace advice given to you by your health care provider. Make sure you discuss any questions you have with your health care provider. Document Released: 10/11/2007 Document Revised: 03/03/2016 Document Reviewed: 03/03/2016 Elsevier Interactive Patient Education  2017 [employer].    Follow-Up: At [facility], you and your health needs are our priority.  As part of our continuing mission to provide you with exceptional heart care, we have created designated Provider  Care Teams.  These Care Teams include your primary Cardiologist (physician) and Advanced Practice Providers (APPs -  Physician Assistants and Nurse Practitioners) who all work together to provide you with the care you need, when you need it.  We recommend signing up for the patient portal called "MyChart".  Sign up information is provided on this After Visit Summary.  MyChart is used to connect with patients for Virtual Visits (Telemedicine).  Patients are able to view lab/test results, encounter notes, upcoming appointments, etc.  Non-urgent messages  can be sent to your provider as well.   To learn more about what you can do with MyChart, go to [email or web address].    Your next appointment:   6 month(s)  Provider:   Thomasene Ripple, DO   Other Instructions You are invited to attend: [facility] event on February 1st at [facility] [place], [place], [place] [address]) from 8am-12pm. Feel free to invite other women to attend!  See you there!    Diabetes Mellitus and Nutrition, Adult When you have diabetes, or diabetes mellitus, it is very important to have healthy eating habits because your blood sugar (glucose) levels are greatly affected by what you eat and drink. Eating healthy foods in the right amounts, at about the same times every day, can help you: Manage your blood glucose. Lower your risk of heart disease. Improve your blood pressure. Reach or maintain a healthy weight. What can affect my meal plan? Every person with diabetes is different, and each person has different needs for a meal plan. Your health care provider may recommend that you work with a dietitian to make a meal plan that is best for you. Your meal plan may vary depending on factors such as: The calories you need. The medicines you take. Your weight. Your blood glucose, blood pressure, and cholesterol levels. Your activity level. Other health conditions you have, such as heart or kidney  disease. How do carbohydrates affect me? Carbohydrates, also called carbs, affect your blood glucose level more than any other type of food. Eating carbs raises the amount of glucose in your blood. It is important to know how many carbs you can safely have in each meal. This is different for every person. Your dietitian can help you calculate how many carbs you should have at each meal and for each snack. How does alcohol affect me? Alcohol can cause a decrease in blood glucose (hypoglycemia), especially if you use insulin or take certain diabetes medicines by mouth. Hypoglycemia can be a life-threatening condition. Symptoms of hypoglycemia, such as sleepiness, dizziness, and confusion, are similar to symptoms of having too much alcohol. Do not drink alcohol if: Your health care provider tells you not to drink. You are pregnant, may be pregnant, or are planning to become pregnant. If you drink alcohol: Limit how much you have to: 0-1 drink a day for women. 0-2 drinks a day for men. Know how much alcohol is in your drink. In the [place] one drink equals one 12 oz bottle of beer (355 mL), one 5 oz glass of wine (148 mL), or one 1 oz glass of hard liquor (44 mL). Keep yourself hydrated with water, diet soda, or unsweetened iced tea. Keep in mind that regular soda, juice, and other mixers may contain a lot of sugar and must be counted as carbs. What are tips for following this plan?  Reading food labels Start by checking the serving size on the Nutrition Facts label of packaged foods and drinks. The number of calories and the amount of carbs, fats, and other nutrients listed on the label are based on one serving of the item. Many items contain more than one serving per package. Check the total grams (g) of carbs in one serving. Check the number of grams of saturated fats and trans fats in one serving. Choose foods that have a low amount or none of these fats. Check the number of milligrams (mg) of  salt (sodium) in one serving. Most people should limit  total sodium intake to less than 2,300 mg per day. Always check the nutrition information of foods labeled as "low-fat" or "nonfat." These foods may be higher in added sugar or refined carbs and should be avoided. Talk to your dietitian to identify your daily goals for nutrients listed on the label. Shopping Avoid buying canned, pre-made, or processed foods. These foods tend to be high in fat, sodium, and added sugar. Shop around the outside edge of the grocery store. This is where you will most often find fresh fruits and vegetables, bulk grains, fresh meats, and fresh dairy products. Cooking Use low-heat cooking methods, such as baking, instead of high-heat cooking methods, such as deep frying. Cook using healthy oils, such as olive, canola, or sunflower oil. Avoid cooking with butter, cream, or high-fat meats. Meal planning Eat meals and snacks regularly, preferably at the same times every day. Avoid going long periods of time without eating. Eat foods that are high in fiber, such as fresh fruits, vegetables, beans, and whole grains. Eat 4-6 oz (112-168 g) of lean protein each day, such as lean meat, chicken, fish, eggs, or tofu. One ounce (oz) (28 g) of lean protein is equal to: 1 oz (28 g) of meat, chicken, or fish. 1 egg.  cup (62 g) of tofu. Eat some foods each day that contain healthy fats, such as avocado, nuts, seeds, and fish. What foods should I eat? Fruits Berries. Apples. Oranges. Peaches. Apricots. Plums. Grapes. Mangoes. Papayas. Pomegranates. Kiwi. Cherries. Vegetables Leafy greens, including lettuce, spinach, kale, chard, collard greens, mustard greens, and cabbage. Beets. Cauliflower. Broccoli. Carrots. Green beans. Tomatoes. Peppers. Onions. Cucumbers. Brussels sprouts. Grains Whole grains, such as whole-wheat or whole-grain bread, crackers, tortillas, cereal, and pasta. Unsweetened oatmeal. Quinoa. Brown or wild  rice. Meats and other proteins Seafood. Poultry without skin. Lean cuts of poultry and beef. Tofu. Nuts. Seeds. Dairy Low-fat or fat-free dairy products such as milk, yogurt, and cheese. The items listed above may not be a complete list of foods and beverages you can eat and drink. Contact a dietitian for more information. What foods should I avoid? Fruits Fruits canned with syrup. Vegetables Canned vegetables. Frozen vegetables with butter or cream sauce. Grains Refined white flour and flour products such as bread, pasta, snack foods, and cereals. Avoid all processed foods. Meats and other proteins Fatty cuts of meat. Poultry with skin. Breaded or fried meats. Processed meat. Avoid saturated fats. Dairy Full-fat yogurt, cheese, or milk. Beverages Sweetened drinks, such as soda or iced tea. The items listed above may not be a complete list of foods and beverages you should avoid. Contact a dietitian for more information. Questions to ask a health care provider Do I need to meet with a certified diabetes care and education specialist? Do I need to meet with a dietitian? What number can I call if I have questions? When are the best times to check my blood glucose? Where to find more information: American Diabetes Association: diabetes.org Academy of Nutrition and Dietetics: eatright.[employer] of Diabetes and Digestive and Kidney Diseases: StageSync.si Association of Diabetes Care & Education Specialists: diabeteseducator.org Summary It is important to have healthy eating habits because your blood sugar (glucose) levels are greatly affected by what you eat and drink. It is important to use alcohol carefully. A healthy meal plan will help you manage your blood glucose and lower your risk of heart disease. Your health care provider may recommend that you work with a dietitian to make a meal  plan that is best for you. This information is not intended to replace advice given  to you by your health care provider. Make sure you discuss any questions you have with your health care provider. Document Revised: 11/16/2019 Document Reviewed: 11/16/2019 Elsevier Patient Education  2024 Elsevier Inc.  Mediterranean Diet A Mediterranean diet is based on the traditions of countries on the [employer]. It focuses on eating more: Fruits and vegetables. Whole grains, beans, nuts, and seeds. Heart-healthy fats. These are fats that are good for your heart. It involves eating less: Dairy. Meat and eggs. Processed foods with added sugar, salt, and fat. This type of diet can help prevent certain conditions. It can also improve outcomes if you have a long-term (chronic) disease, such as kidney or heart disease. What are tips for following this plan? Reading food labels Check packaged foods for: The serving size. For foods such as rice and pasta, the serving size is the amount of cooked product, not dry. The total fat. Avoid foods with saturated fat or trans fat. Added sugars, such as corn syrup. Shopping  Try to have a balanced diet. Buy a variety of foods, such as: Fresh fruits and vegetables. You may be able to get these from local farmers markets. You can also buy them frozen. Grains, beans, nuts, and seeds. Some of these can be bought in bulk. Fresh seafood. Poultry and eggs. Low-fat dairy products. Buy whole ingredients instead of foods that have already been packaged. If you can't get fresh seafood, buy precooked frozen shrimp or canned fish, such as tuna, salmon, or sardines. Stock your pantry so you always have certain foods on hand, such as olive oil, canned tuna, canned tomatoes, rice, pasta, and beans. Cooking Cook foods with extra-virgin olive oil instead of using butter or other vegetable oils. Have meat as a side dish. Have vegetables or grains as your main dish. This means having meat in small portions or adding small amounts of meat to foods like pasta  or stew. Use beans or vegetables instead of meat in common dishes like chili or lasagna. Try out different cooking methods. Try roasting, broiling, steaming, and sauting vegetables. Add frozen vegetables to soups, stews, pasta, or rice. Add nuts or seeds for added healthy fats and plant protein at each meal. You can add these to yogurt, salads, or vegetable dishes. Marinate fish or vegetables using olive oil, lemon juice, garlic, and fresh herbs. Meal planning Plan to eat a vegetarian meal one day each week. Try to work up to two vegetarian meals, if possible. Eat seafood two or more times a week. Have healthy snacks on hand. These may include: Vegetable sticks with hummus. Greek yogurt. Fruit and nut trail mix. Eat balanced meals. These should include: Fruit: 2-3 servings a day. Vegetables: 4-5 servings a day. Low-fat dairy: 2 servings a day. Fish, poultry, or lean meat: 1 serving a day. Beans and legumes: 2 or more servings a week. Nuts and seeds: 1-2 servings a day. Whole grains: 6-8 servings a day. Extra-virgin olive oil: 3-4 servings a day. Limit red meat and sweets to just a few servings a month. Lifestyle  Try to cook and eat meals with your family. Drink enough fluid to keep your pee (urine) pale yellow. Be active every day. This includes: Aerobic exercise, which is exercise that causes your heart to beat faster. Examples include running and swimming. Leisure activities like gardening, walking, or housework. Get 7-8 hours of sleep each night. Drink red wine if  your provider says you can. A glass of wine is 5 oz (150 mL). You may be allowed to have: Up to 1 glass a day if you're female and not pregnant. Up to 2 glasses a day if you're female. What foods should I eat? Fruits Apples. Apricots. Avocado. Berries. Bananas. Cherries. Dates. Figs. Grapes. Lemons. Melon. Oranges. Peaches. Plums. Pomegranate. Vegetables Artichokes. Beets. Broccoli. Cabbage. Carrots. Eggplant. Green  beans. Chard. Kale. Spinach. Onions. Leeks. Peas. Squash. Tomatoes. Peppers. Radishes. Grains Whole-grain pasta. Brown rice. Bulgur wheat. Polenta. Couscous. Whole-wheat bread. Orpah Cobb. Meats and other proteins Beans. Almonds. Sunflower seeds. Pine nuts. Peanuts. Cod. Salmon. Scallops. Shrimp. Tuna. Tilapia. Clams. Oysters. Eggs. Chicken or [place] without skin. Dairy Low-fat milk. Cheese. Greek yogurt. Fats and oils Extra-virgin olive oil. Avocado oil. Grapeseed oil. Beverages Water. Red wine. Herbal tea. Sweets and desserts Greek yogurt with honey. Baked apples. Poached pears. Trail mix. Seasonings and condiments Basil. Cilantro. Coriander. Cumin. Mint. Parsley. Sage. Rosemary. Tarragon. Garlic. Oregano. Thyme. Pepper. Balsamic vinegar. Tahini. Hummus. Tomato sauce. Olives. Mushrooms. The items listed above may not be all the foods and drinks you can have. Talk to a dietitian to learn more. What foods should I limit? This is a list of foods that should be eaten rarely. Fruits Fruit canned in syrup. Vegetables Deep-fried potatoes, like [place] fries. Grains Packaged pasta or rice dishes. Cereal with added sugar. Snacks with added sugar. Meats and other proteins Beef. Pork. Lamb. Chicken or [place] with skin. Hot dogs. Tomasa Blase. Dairy Ice cream. Sour cream. Whole milk. Fats and oils Butter. Canola oil. Vegetable oil. Beef fat (tallow). Lard. Beverages Juice. Sugar-sweetened soft drinks. Beer. Liquor and spirits. Sweets and desserts Cookies. Cakes. Pies. Candy. Seasonings and condiments Mayonnaise. Pre-made sauces and marinades. The items listed above may not be all the foods and drinks you should limit. Talk to a dietitian to learn more. Where to find more information American Heart Association (AHA): heart.org This information is not intended to replace advice given to you by your health care provider. Make sure you discuss any questions you have with your health care  provider. Document Revised: 07/27/2022 Document Reviewed: 07/27/2022 Elsevier Patient Education  2024 Elsevier Inc.      Adopting a Healthy Lifestyle.  Know what a healthy weight is for you (roughly BMI <25) and aim to maintain this   Aim for 7+ servings of fruits and vegetables daily   65-80+ fluid ounces of water or unsweet tea for healthy kidneys   Limit to max 1 drink of alcohol per day; avoid smoking/tobacco   Limit animal fats in diet for cholesterol and heart health - choose grass fed whenever available   Avoid highly processed foods, and foods high in saturated/trans fats   Aim for low stress - take time to unwind and care for your mental health   Aim for 150 min of moderate intensity exercise weekly for heart health, and weights twice weekly for bone health   Aim for 7-9 hours of sleep daily   When it comes to diets, agreement about the perfect plan isnt easy to find, even among the experts. Experts at the [facility] [employer] developed an idea known as the Healthy Eating Plate. Just imagine a plate divided into logical, healthy portions.   The emphasis is on diet quality:   Load up on vegetables and fruits - one-half of your plate: Aim for color and variety, and remember that potatoes dont count.   Go for whole grains -  one-quarter of your plate: Whole wheat, barley, wheat berries, quinoa, oats, brown rice, and foods made with them. If you want pasta, go with whole wheat pasta.   Protein power - one-quarter of your plate: Fish, chicken, beans, and nuts are all healthy, versatile protein sources. Limit red meat.   The diet, however, does go beyond the plate, offering a few other suggestions.   Use healthy plant oils, such as olive, canola, soy, corn, sunflower and peanut. Check the labels, and avoid partially hydrogenated oil, which have unhealthy trans fats.   If youre thirsty, drink water. Coffee and tea are good in moderation, but skip sugary  drinks and limit milk and dairy products to one or two daily servings.   The type of carbohydrate in the diet is more important than the amount. Some sources of carbohydrates, such as vegetables, fruits, whole grains, and beans-are healthier than others.   Finally, stay active  Signed, Thomasene Ripple, DO  03/12/2023 10:27 AM    [facility]

## 2023-03-13 ENCOUNTER — Other Ambulatory Visit (HOSPITAL_COMMUNITY): Payer: Self-pay

## 2023-03-13 LAB — VITAMIN D 25 HYDROXY (VIT D DEFICIENCY, FRACTURES): Vit D, 25-Hydroxy: 19.2 ng/mL — ABNORMAL LOW (ref 30.0–100.0)

## 2023-03-17 ENCOUNTER — Other Ambulatory Visit (HOSPITAL_COMMUNITY): Payer: Self-pay

## 2023-03-23 ENCOUNTER — Other Ambulatory Visit: Payer: Self-pay

## 2023-03-23 MED ORDER — VITAMIN D (ERGOCALCIFEROL) 1.25 MG (50000 UNIT) PO CAPS: 50000.0000 [IU] | ORAL_CAPSULE | ORAL | 0 refills | Status: DC

## 2023-03-23 NOTE — Progress Notes (Signed)
Called pt, prescription sent to pharmacy.

## 2023-03-25 ENCOUNTER — Other Ambulatory Visit (HOSPITAL_COMMUNITY): Payer: Self-pay

## 2023-03-31 ENCOUNTER — Other Ambulatory Visit (HOSPITAL_COMMUNITY): Payer: Self-pay

## 2023-04-16 ENCOUNTER — Telehealth: Payer: Self-pay

## 2023-04-16 ENCOUNTER — Ambulatory Visit (HOSPITAL_BASED_OUTPATIENT_CLINIC_OR_DEPARTMENT_OTHER)
Admission: RE | Admit: 2023-04-16 | Discharge: 2023-04-16 | Disposition: A | Discharge status: Home / Self Care | Payer: Self-pay | Source: Ambulatory Visit | Attending: Cardiology | Admitting: Cardiology

## 2023-04-16 ENCOUNTER — Other Ambulatory Visit (HOSPITAL_BASED_OUTPATIENT_CLINIC_OR_DEPARTMENT_OTHER): Payer: BC Managed Care – PPO

## 2023-04-16 DIAGNOSIS — I2510 Atherosclerotic heart disease of native coronary artery without angina pectoris: Secondary | ICD-10-CM | POA: Insufficient documentation

## 2023-04-16 DIAGNOSIS — R5383 Other fatigue: Secondary | ICD-10-CM

## 2023-04-16 DIAGNOSIS — I10 Essential (primary) hypertension: Secondary | ICD-10-CM

## 2023-04-16 DIAGNOSIS — R0609 Other forms of dyspnea: Secondary | ICD-10-CM | POA: Diagnosis not present

## 2023-04-16 LAB — ECHOCARDIOGRAM COMPLETE
AV Vena cont: 0.25 cm
Area-P 1/2: 4.39 cm2
MV M vel: 2.42 m/s
MV Peak grad: 23.4 mm[Hg]
S' Lateral: 2.86 cm

## 2023-04-16 NOTE — Telephone Encounter (Signed)
Called pt back to make sure she could come tomorrow at 8:40am to see Dr. Servando Salina. Pt confirms that she will be able to make this time.

## 2023-04-16 NOTE — Telephone Encounter (Signed)
  Per Dr. Servando Salina, pt needs appointment to be seen tomorrow for elevated blood pressure. Left call back number for her to call back.

## 2023-04-17 ENCOUNTER — Ambulatory Visit: Payer: BC Managed Care – PPO | Attending: Cardiology | Admitting: Cardiology

## 2023-04-17 ENCOUNTER — Encounter: Payer: Self-pay | Admitting: Cardiology

## 2023-04-17 VITALS — BP 124/98 | HR 92 | Ht 64.0 in | Wt 202.0 lb

## 2023-04-17 DIAGNOSIS — E669 Obesity, unspecified: Secondary | ICD-10-CM | POA: Diagnosis not present

## 2023-04-17 DIAGNOSIS — Z712 Person consulting for explanation of examination or test findings: Secondary | ICD-10-CM

## 2023-04-17 DIAGNOSIS — I10 Essential (primary) hypertension: Secondary | ICD-10-CM

## 2023-04-17 DIAGNOSIS — E1100 Type 2 diabetes mellitus with hyperosmolarity without nonketotic hyperglycemic-hyperosmolar coma (NKHHC): Secondary | ICD-10-CM

## 2023-04-17 MED ORDER — AMLODIPINE BESYLATE 5 MG PO TABS: 5.0000 mg | ORAL_TABLET | Freq: Every day | ORAL | 3 refills | Status: DC

## 2023-04-17 NOTE — Patient Instructions (Addendum)
Medication Instructions:   Start taking Amlodipine 5 mg daily   *If you need a refill on your cardiac medications before your next appointment, please call your pharmacy*   Lab Work:  Not needed   Testing/Procedures:  Not needed   Follow-Up: At [facility], you and your health needs are our priority.  As part of our continuing mission to provide you with exceptional heart care, we have created designated Provider Care Teams.  These Care Teams include your primary Cardiologist (physician) and Advanced Practice Providers (APPs -  Physician Assistants and Nurse Practitioners) who all work together to provide you with the care you need, when you need it.  We recommend signing up for the patient portal called "MyChart".  Sign up information is provided on this After Visit Summary.  MyChart is used to connect with patients for Virtual Visits (Telemedicine).  Patients are able to view lab/test results, encounter notes, upcoming appointments, etc.  Non-urgent messages can be sent to your provider as well.   To learn more about what you can do with MyChart, go to [email or web address].    Your next appointment:   16 week(s)  The format for your next appointment:   Virtual Visit   Provider:   Thomasene Ripple, DO    Other Instructions    Check blood pressure for the next 2weeks  and the reading to Dr Servando Salina through [place]

## 2023-04-18 NOTE — Progress Notes (Incomplete)
Cardiology Office Note:    Date:  04/18/2023   ID:  Sharon Mcbride, DOB 14-Oct-1985, MRN [id number]  PCP:  Caffie Damme, MD  Cardiologist:  Thomasene Ripple, DO  Electrophysiologist:  None   Referring MD: Caffie Damme, MD   Chief Complaint  Patient presents with  . Follow-up    Elevated BP. Patient has been having pressure behind her eyes.   History of Present Illness:    Sharon Mcbride is a 37 y.o. female with a hx of hypertension, Type 2 Diabetes Mellitus, obesity,   Past Medical History:  Diagnosis Date  . Abnormal Pap smear 2004   colpo.,2004; inflammation, 2006  . Allergy    seasonal  . Anemia 06/12/2008  . H/O candidiasis   . H/O hemorrhoids 12/03/10  . H/O varicella   . History of abnormal Pap smear 10/03/2011   colpo 2004  . History of hepatitis B    vaccine  . History of induced abortion 10/03/2011   EAB 2008 at 12 weeks  . Trichomonas   . Varicose veins of legs    HAD TX SCHED WITH SPECIALISTS PRIOR TO +UPT  . Yeast infection     Past Surgical History:  Procedure Laterality Date  . CESAREAN SECTION     primary low -transverse  . WISDOM TOOTH EXTRACTION     ONLY 2 TEETH SO FAR    Current Medications: Current Meds  Medication Sig  . amLODipine (NORVASC) 5 MG tablet Take 1 tablet (5 mg total) by mouth daily.  . Multiple Vitamin (MULTIVITAMIN ADULT PO) Take by mouth daily.  Marland Kitchen olmesartan-hydrochlorothiazide (BENICAR HCT) 20-12.5 MG tablet Take 1 tablet by mouth daily.  Marland Kitchen OZEMPIC, 0.25 OR 0.5 MG/DOSE, 2 MG/3ML SOPN once a week.  . Vitamin D, Ergocalciferol, (DRISDOL) 1.25 MG (50000 UNIT) CAPS capsule Take 1 capsule (50,000 Units total) by mouth every 7 (seven) days.     Allergies:   Patient has no known allergies.   Social History   Socioeconomic History  . Marital status: Single    Spouse name: DARRICK Stirn  . Number of children: 1  . Years of education: 24  . Highest education level: Not on file  Occupational History  . Occupation: MANAGEMENT     Employer: [employer]  Tobacco Use  . Smoking status: Never  . Smokeless tobacco: Never  Vaping Use  . Vaping status: Never Used  Substance and Sexual Activity  . Alcohol use: No  . Drug use: No  . Sexual activity: Yes    Partners: Male    Birth control/protection: None    Comment: currently pregnant  Other Topics Concern  . Not on file  Social History Narrative  . Not on file   Social Drivers of Health   Financial Resource Strain: Not on file  Food Insecurity: Not on file  Transportation Needs: Not on file  Physical Activity: Not on file  Stress: Not on file  Social Connections: Not on file     Family History: The patient's family history includes Alcohol abuse in her maternal uncle; Anemia in her mother; Cancer in her paternal grandmother; Diabetes in her maternal grandfather and maternal grandmother; Drug abuse in her maternal uncle; Hypertension in her maternal grandmother; Kidney disease in her cousin and maternal uncle; Mental retardation in her paternal uncle. There is no history of Anesthesia problems.  ROS:   Review of Systems  Constitution: Negative for decreased appetite, fever and weight gain.  HENT: Negative for congestion, ear  discharge, hoarse voice and sore throat.   Eyes: Negative for discharge, redness, vision loss in right eye and visual halos.  Cardiovascular: Negative for chest pain, dyspnea on exertion, leg swelling, orthopnea and palpitations.  Respiratory: Negative for cough, hemoptysis, shortness of breath and snoring.   Endocrine: Negative for heat intolerance and polyphagia.  Hematologic/Lymphatic: Negative for bleeding problem. Does not bruise/bleed easily.  Skin: Negative for flushing, nail changes, rash and suspicious lesions.  Musculoskeletal: Negative for arthritis, joint pain, muscle cramps, myalgias, neck pain and stiffness.  Gastrointestinal: Negative for abdominal pain, bowel incontinence, diarrhea and excessive appetite.   Genitourinary: Negative for decreased libido, genital sores and incomplete emptying.  Neurological: Negative for brief paralysis, focal weakness, headaches and loss of balance.  Psychiatric/Behavioral: Negative for altered mental status, depression and suicidal ideas.  Allergic/Immunologic: Negative for HIV exposure and persistent infections.    EKGs/Labs/Other Studies Reviewed:    The following studies were reviewed today:   EKG:  The ekg ordered today demonstrates   Recent Labs: No results found for requested labs within last 365 days.  Recent Lipid Panel No results found for: "CHOL", "TRIG", "HDL", "CHOLHDL", "VLDL", "LDLCALC", "LDLDIRECT"  Physical Exam:    VS:  BP (!) 124/98 (BP Location: Left Arm, Patient Position: Sitting, Cuff Size: Normal)   Pulse 92   Ht 5\' 4"  (1.626 m)   Wt 202 lb (91.6 kg)   BMI 34.67 kg/m     Wt Readings from Last 3 Encounters:  04/17/23 202 lb (91.6 kg)  03/12/23 201 lb 12.8 oz (91.5 kg)  12/11/21 194 lb 0.1 oz (88 kg)     GEN: Well nourished, well developed in no acute distress HEENT: Normal NECK: No JVD; No carotid bruits LYMPHATICS: No lymphadenopathy CARDIAC: S1S2 noted,RRR, no murmurs, rubs, gallops RESPIRATORY:  Clear to auscultation without rales, wheezing or rhonchi  ABDOMEN: Soft, non-tender, non-distended, +bowel sounds, no guarding. EXTREMITIES: No edema, No cyanosis, no clubbing MUSCULOSKELETAL:  No deformity  SKIN: Warm and dry NEUROLOGIC:  Alert and oriented x 3, non-focal PSYCHIATRIC:  Normal affect, good insight  ASSESSMENT:    No diagnosis found. PLAN:     1.  The patient is in agreement with the above plan. The patient left the office in stable condition.  The patient will follow up in   Medication Adjustments/Labs and Tests Ordered: Current medicines are reviewed at length with the patient today.  Concerns regarding medicines are outlined above.  No orders of the defined types were placed in this  encounter.  Meds ordered this encounter  Medications  . amLODipine (NORVASC) 5 MG tablet    Sig: Take 1 tablet (5 mg total) by mouth daily.    Dispense:  90 tablet    Refill:  3    Patient Instructions  Medication Instructions:   Start taking Amlodipine 5 mg daily   *If you need a refill on your cardiac medications before your next appointment, please call your pharmacy*   Lab Work:  Not needed   Testing/Procedures:  Not needed   Follow-Up: At [facility], you and your health needs are our priority.  As part of our continuing mission to provide you with exceptional heart care, we have created designated Provider Care Teams.  These Care Teams include your primary Cardiologist (physician) and Advanced Practice Providers (APPs -  Physician Assistants and Nurse Practitioners) who all work together to provide you with the care you need, when you need it.  We recommend signing up for the patient  portal called "MyChart".  Sign up information is provided on this After Visit Summary.  MyChart is used to connect with patients for Virtual Visits (Telemedicine).  Patients are able to view lab/test results, encounter notes, upcoming appointments, etc.  Non-urgent messages can be sent to your provider as well.   To learn more about what you can do with MyChart, go to [email or web address].    Your next appointment:   16 week(s)  The format for your next appointment:   Virtual Visit   Provider:   Thomasene Ripple, DO    Other Instructions    Check blood pressure for the next 2weeks  and the reading to Dr Servando Salina through [place]    Adopting a Healthy Lifestyle.  Know what a healthy weight is for you (roughly BMI <25) and aim to maintain this   Aim for 7+ servings of fruits and vegetables daily   65-80+ fluid ounces of water or unsweet tea for healthy kidneys   Limit to max 1 drink of alcohol per day; avoid smoking/tobacco   Limit animal fats in diet for cholesterol and heart  health - choose grass fed whenever available   Avoid highly processed foods, and foods high in saturated/trans fats   Aim for low stress - take time to unwind and care for your mental health   Aim for 150 min of moderate intensity exercise weekly for heart health, and weights twice weekly for bone health   Aim for 7-9 hours of sleep daily   When it comes to diets, agreement about the perfect plan isnt easy to find, even among the experts. Experts at the [facility] [employer] developed an idea known as the Healthy Eating Plate. Just imagine a plate divided into logical, healthy portions.   The emphasis is on diet quality:   Load up on vegetables and fruits - one-half of your plate: Aim for color and variety, and remember that potatoes dont count.   Go for whole grains - one-quarter of your plate: Whole wheat, barley, wheat berries, quinoa, oats, brown rice, and foods made with them. If you want pasta, go with whole wheat pasta.   Protein power - one-quarter of your plate: Fish, chicken, beans, and nuts are all healthy, versatile protein sources. Limit red meat.   The diet, however, does go beyond the plate, offering a few other suggestions.   Use healthy plant oils, such as olive, canola, soy, corn, sunflower and peanut. Check the labels, and avoid partially hydrogenated oil, which have unhealthy trans fats.   If youre thirsty, drink water. Coffee and tea are good in moderation, but skip sugary drinks and limit milk and dairy products to one or two daily servings.   The type of carbohydrate in the diet is more important than the amount. Some sources of carbohydrates, such as vegetables, fruits, whole grains, and beans-are healthier than others.   Finally, stay active  Signed, Thomasene Ripple, DO  04/18/2023 11:52 PM    [facility]

## 2023-04-18 NOTE — Progress Notes (Signed)
Cardiology Office Note:    Date:  04/19/2023   ID:  Sharon Mcbride, DOB 28-Dec-1985, MRN [id number]  PCP:  Caffie Damme, MD  Cardiologist:  Thomasene Ripple, DO  Electrophysiologist:  None   Referring MD: Caffie Damme, MD   Chief Complaint  Patient presents with   Follow-up    Elevated BP. Patient has been having pressure behind her eyes.   History of Present Illness:    Sharon Mcbride is a 37 y.o. female with a hx of hypertension, Type 2 Diabetes Mellitus, obesity is here today for a follow up visit after a recent CT scan and echocardiogram. They have been taking medication to manage their blood pressure, but the diastolic number remains slightly high. The patient has been diligent in taking their medication as prescribed, including an additional pill as directed by the doctor. They express concern about their sodium intake, particularly from protein shakes, and the doctor provides advice on alternatives. The patient is committed to managing their hypertension and is open to adjustments in their medication regimen.   Past Medical History:  Diagnosis Date   Abnormal Pap smear 2004   colpo.,2004; inflammation, 2006   Allergy    seasonal   Anemia 06/12/2008   H/O candidiasis    H/O hemorrhoids 12/03/10   H/O varicella    History of abnormal Pap smear 10/03/2011   colpo 2004   History of hepatitis B    vaccine   History of induced abortion 10/03/2011   EAB 2008 at 12 weeks   Trichomonas    Varicose veins of legs    HAD TX SCHED WITH SPECIALISTS PRIOR TO +UPT   Yeast infection     Past Surgical History:  Procedure Laterality Date   CESAREAN SECTION     primary low -transverse   WISDOM TOOTH EXTRACTION     ONLY 2 TEETH SO FAR    Current Medications: Current Meds  Medication Sig   amLODipine (NORVASC) 5 MG tablet Take 1 tablet (5 mg total) by mouth daily.   Multiple Vitamin (MULTIVITAMIN ADULT PO) Take by mouth daily.   olmesartan-hydrochlorothiazide (BENICAR HCT) 20-12.5 MG  tablet Take 1 tablet by mouth daily.   OZEMPIC, 0.25 OR 0.5 MG/DOSE, 2 MG/3ML SOPN once a week.   Vitamin D, Ergocalciferol, (DRISDOL) 1.25 MG (50000 UNIT) CAPS capsule Take 1 capsule (50,000 Units total) by mouth every 7 (seven) days.     Allergies:   Patient has no known allergies.   Social History   Socioeconomic History   Marital status: Single    Spouse name: DARRICK Purtee   Number of children: 1   Years of education: 16   Highest education level: Not on file  Occupational History   Occupation: MANAGEMENT    Employer: [employer]  Tobacco Use   Smoking status: Never   Smokeless tobacco: Never  Vaping Use   Vaping status: Never Used  Substance and Sexual Activity   Alcohol use: No   Drug use: No   Sexual activity: Yes    Partners: Male    Birth control/protection: None    Comment: currently pregnant  Other Topics Concern   Not on file  Social History Narrative   Not on file   Social Drivers of Health   Financial Resource Strain: Not on file  Food Insecurity: Not on file  Transportation Needs: Not on file  Physical Activity: Not on file  Stress: Not on file  Social Connections: Not on file  Family History: The patient's family history includes Alcohol abuse in her maternal uncle; Anemia in her mother; Cancer in her paternal grandmother; Diabetes in her maternal grandfather and maternal grandmother; Drug abuse in her maternal uncle; Hypertension in her maternal grandmother; Kidney disease in her cousin and maternal uncle; Mental retardation in her paternal uncle. There is no history of Anesthesia problems.  ROS:   Review of Systems  Constitution: Negative for decreased appetite, fever and weight gain.  HENT: Negative for congestion, ear discharge, hoarse voice and sore throat.   Eyes: Negative for discharge, redness, vision loss in right eye and visual halos.  Cardiovascular: Negative for chest pain, dyspnea on exertion, leg swelling, orthopnea and  palpitations.  Respiratory: Negative for cough, hemoptysis, shortness of breath and snoring.   Endocrine: Negative for heat intolerance and polyphagia.  Hematologic/Lymphatic: Negative for bleeding problem. Does not bruise/bleed easily.  Skin: Negative for flushing, nail changes, rash and suspicious lesions.  Musculoskeletal: Negative for arthritis, joint pain, muscle cramps, myalgias, neck pain and stiffness.  Gastrointestinal: Negative for abdominal pain, bowel incontinence, diarrhea and excessive appetite.  Genitourinary: Negative for decreased libido, genital sores and incomplete emptying.  Neurological: Negative for brief paralysis, focal weakness, headaches and loss of balance.  Psychiatric/Behavioral: Negative for altered mental status, depression and suicidal ideas.  Allergic/Immunologic: Negative for HIV exposure and persistent infections.    EKGs/Labs/Other Studies Reviewed:    The following studies were reviewed today:   EKG:  The ekg ordered today demonstrates   Recent Labs: No results found for requested labs within last 365 days.  Recent Lipid Panel No results found for: "CHOL", "TRIG", "HDL", "CHOLHDL", "VLDL", "LDLCALC", "LDLDIRECT"  Physical Exam:    VS:  BP (!) 124/98 (BP Location: Left Arm, Patient Position: Sitting, Cuff Size: Normal)   Pulse 92   Ht 5\' 4"  (1.626 m)   Wt 202 lb (91.6 kg)   BMI 34.67 kg/m     Wt Readings from Last 3 Encounters:  04/17/23 202 lb (91.6 kg)  03/12/23 201 lb 12.8 oz (91.5 kg)  12/11/21 194 lb 0.1 oz (88 kg)     GEN: Well nourished, well developed in no acute distress HEENT: Normal NECK: No JVD; No carotid bruits LYMPHATICS: No lymphadenopathy CARDIAC: S1S2 noted,RRR, no murmurs, rubs, gallops RESPIRATORY:  Clear to auscultation without rales, wheezing or rhonchi  ABDOMEN: Soft, non-tender, non-distended, +bowel sounds, no guarding. EXTREMITIES: No edema, No cyanosis, no clubbing MUSCULOSKELETAL:  No deformity  SKIN:  Warm and dry NEUROLOGIC:  Alert and oriented x 3, non-focal PSYCHIATRIC:  Normal affect, good insight  ASSESSMENT:    1. Primary hypertension   2. Type 2 diabetes mellitus with hyperosmolarity without coma, without long-term current use of insulin (HCC)   3. Obesity (BMI 30-39.9)   4. Encounter to discuss test results    PLAN:    Hypertension - Elevated diastolic blood pressure despite increased medication. Discussed the physiology of systolic and diastolic blood pressure. Plan to add amlodipine to the regimen, but concerned about potential side effects such as leg swelling. Start Amlodipine 5mg  daily. Continue olmesartan- hydrochlorothiazide.  Monitor blood pressure daily for two weeks and report via MyChart. Plan for a virtual visit in 16 weeks to assess blood pressure control and tolerance to amlodipine.  Reviewed CT scan and echocardiogram -  No calcified plaques noted. Heart function is normal and no significant valve regurgitation. Discussed potential for future coronary CT if symptoms develop. No immediate action required.  General Health Maintenance - Encouraged  to continue healthy lifestyle habits.  The patient is in agreement with the above plan. The patient left the office in stable condition.  The patient will follow up in   Medication Adjustments/Labs and Tests Ordered: Current medicines are reviewed at length with the patient today.  Concerns regarding medicines are outlined above.  No orders of the defined types were placed in this encounter.  Meds ordered this encounter  Medications   amLODipine (NORVASC) 5 MG tablet    Sig: Take 1 tablet (5 mg total) by mouth daily.    Dispense:  90 tablet    Refill:  3    Patient Instructions  Medication Instructions:   Start taking Amlodipine 5 mg daily   *If you need a refill on your cardiac medications before your next appointment, please call your pharmacy*   Lab Work:  Not needed   Testing/Procedures:  Not  needed   Follow-Up: At [facility], you and your health needs are our priority.  As part of our continuing mission to provide you with exceptional heart care, we have created designated Provider Care Teams.  These Care Teams include your primary Cardiologist (physician) and Advanced Practice Providers (APPs -  Physician Assistants and Nurse Practitioners) who all work together to provide you with the care you need, when you need it.  We recommend signing up for the patient portal called "MyChart".  Sign up information is provided on this After Visit Summary.  MyChart is used to connect with patients for Virtual Visits (Telemedicine).  Patients are able to view lab/test results, encounter notes, upcoming appointments, etc.  Non-urgent messages can be sent to your provider as well.   To learn more about what you can do with MyChart, go to [email or web address].    Your next appointment:   16 week(s)  The format for your next appointment:   Virtual Visit   Provider:   Thomasene Ripple, DO    Other Instructions    Check blood pressure for the next 2weeks  and the reading to Dr Servando Salina through [facility]    Adopting a Healthy Lifestyle.  Know what a healthy weight is for you (roughly BMI <25) and aim to maintain this   Aim for 7+ servings of fruits and vegetables daily   65-80+ fluid ounces of water or unsweet tea for healthy kidneys   Limit to max 1 drink of alcohol per day; avoid smoking/tobacco   Limit animal fats in diet for cholesterol and heart health - choose grass fed whenever available   Avoid highly processed foods, and foods high in saturated/trans fats   Aim for low stress - take time to unwind and care for your mental health   Aim for 150 min of moderate intensity exercise weekly for heart health, and weights twice weekly for bone health   Aim for 7-9 hours of sleep daily   When it comes to diets, agreement about the perfect plan isnt easy to find, even among the  experts. Experts at the [facility] [employer] developed an idea known as the Healthy Eating Plate. Just imagine a plate divided into logical, healthy portions.   The emphasis is on diet quality:   Load up on vegetables and fruits - one-half of your plate: Aim for color and variety, and remember that potatoes dont count.   Go for whole grains - one-quarter of your plate: Whole wheat, barley, wheat berries, quinoa, oats, brown rice, and foods made with them. If you want pasta, go  with whole wheat pasta.   Protein power - one-quarter of your plate: Fish, chicken, beans, and nuts are all healthy, versatile protein sources. Limit red meat.   The diet, however, does go beyond the plate, offering a few other suggestions.   Use healthy plant oils, such as olive, canola, soy, corn, sunflower and peanut. Check the labels, and avoid partially hydrogenated oil, which have unhealthy trans fats.   If youre thirsty, drink water. Coffee and tea are good in moderation, but skip sugary drinks and limit milk and dairy products to one or two daily servings.   The type of carbohydrate in the diet is more important than the amount. Some sources of carbohydrates, such as vegetables, fruits, whole grains, and beans-are healthier than others.   Finally, stay active  Signed, Thomasene Ripple, DO  04/19/2023 12:12 AM    [facility]

## 2023-04-20 ENCOUNTER — Other Ambulatory Visit (HOSPITAL_BASED_OUTPATIENT_CLINIC_OR_DEPARTMENT_OTHER): Payer: Self-pay

## 2023-04-20 MED ORDER — BLOOD PRESSURE MONITOR AUTOMAT DEVI
1.0000 [IU] | Freq: Once | 0 refills | Status: AC
  Filled 2023-04-20: qty 1, 30d supply, fill #0

## 2023-04-20 NOTE — Addendum Note (Signed)
Addended by: Reynolds Bowl on: 04/20/2023 02:58 PM   Modules accepted: Orders

## 2023-08-07 ENCOUNTER — Encounter: Payer: Self-pay | Admitting: Cardiology

## 2023-08-07 ENCOUNTER — Ambulatory Visit: Payer: BC Managed Care – PPO | Attending: Cardiology | Admitting: Cardiology

## 2023-08-07 VITALS — BP 121/91 | HR 71 | Ht 64.0 in | Wt 190.0 lb

## 2023-08-07 DIAGNOSIS — E119 Type 2 diabetes mellitus without complications: Secondary | ICD-10-CM

## 2023-08-07 DIAGNOSIS — R21 Rash and other nonspecific skin eruption: Secondary | ICD-10-CM | POA: Diagnosis not present

## 2023-08-07 DIAGNOSIS — E669 Obesity, unspecified: Secondary | ICD-10-CM | POA: Diagnosis not present

## 2023-08-07 DIAGNOSIS — I10 Essential (primary) hypertension: Secondary | ICD-10-CM

## 2023-08-07 MED ORDER — OLMESARTAN MEDOXOMIL 20 MG PO TABS: 20.0000 mg | ORAL_TABLET | Freq: Every day | ORAL | 3 refills | Status: DC

## 2023-08-07 NOTE — Patient Instructions (Addendum)
 Medication Instructions:  Your physician has recommended you make the following change in your medication:  STOP: Amlodipine START: Olmesartan 20 mg once daily *If you need a refill on your cardiac medications before your next appointment, please call your pharmacy*   Follow-Up: At [facility], you and your health needs are our priority.  As part of our continuing mission to provide you with exceptional heart care, our providers are all part of one team.  This team includes your primary Cardiologist (physician) and Advanced Practice Providers or APPs (Physician Assistants and Nurse Practitioners) who all work together to provide you with the care you need, when you need it.  Your next appointment:   6 month(s) via MyChart  Provider:   Thomasene Ripple, DO     Other Instructions: Please take your blood pressure daily for 1 week and send in a MyChart message. Please include heart rates. (One message at the end of the 1 week).   HOW TO TAKE YOUR BLOOD PRESSURE: Rest 5 minutes before taking your blood pressure. Don't smoke or drink caffeinated beverages for at least 30 minutes before. Take your blood pressure before (not after) you eat. Sit comfortably with your back supported and both feet on the floor (don't cross your legs). Elevate your arm to heart level on a table or a desk. Use the proper sized cuff. It should fit smoothly and snugly around your bare upper arm. There should be enough room to slip a fingertip under the cuff. The bottom edge of the cuff should be 1 inch above the crease of the elbow. Ideally, take 3 measurements at one sitting and record the average.     1st Floor: - Lobby - Registration  - Pharmacy  - Lab - Cafe  2nd Floor: - PV Lab - Diagnostic Testing (echo, CT, nuclear med)  3rd Floor: - Vacant  4th Floor: - TCTS (cardiothoracic surgery) - AFib Clinic - Structural Heart Clinic - Vascular Surgery  - Vascular Ultrasound  5th Floor: -  HeartCare Cardiology (general and EP) - Clinical Pharmacy for coumadin, hypertension, lipid, weight-loss medications, and med management appointments    Valet parking services will be available as well.

## 2023-08-07 NOTE — Progress Notes (Signed)
 Virtual Visit via Video Note   Because of Sharon Mcbride's co-morbid illnesses, she is at least at moderate risk for complications without adequate follow up.  This format is felt to be most appropriate for this patient at this time.  All issues noted in this document were discussed and addressed.  A limited physical exam was performed with this format.  Please refer to the patient's chart for her consent to telehealth for [facility].      Date:  08/08/2023   ID:  [name], [name] 1985/11/25, MRN [id number]  Patient Location: Home Provider Location: Office/Clinic  PCP:  Bertrum Brodie, MD  Cardiologist:  Jerryl Morin, DO  Electrophysiologist:  None   Evaluation Performed:  Follow-Up Visit  Chief Complaint:  " I am doing ok, there is a rash on my leg"  Virtual Visit via Video  Note . I connected with the patient today by a   video enabled telemedicine application and verified that I am speaking with the correct person using two identifiers.  History of Present Illness:    Sharon Mcbride is a 38 y.o. female with hypertension, Type 2 Diabetes Mellitus.   She is here for a follow up visit. At her last visit we added Amlodipine to her medication. Since her that visit she has only taken her Amlodipine. She tells me that she recently noticed a rash on her leg. She is not sure if this is related to the start of the amlodipine but it started around the same time.  No other complaints at this time.  The patient does not have symptoms concerning for COVID-19 infection (fever, chills, cough, or new shortness of breath).    Past Medical History:  Diagnosis Date   Abnormal Pap smear 2004   colpo.,2004; inflammation, 2006   Allergy    seasonal   Anemia 06/12/2008   H/O candidiasis    H/O hemorrhoids 12/03/10   H/O varicella    History of abnormal Pap smear 10/03/2011   colpo 2004   History of hepatitis B    vaccine   History of induced abortion 10/03/2011   EAB 2008 at 12 weeks    Trichomonas    Varicose veins of legs    HAD TX SCHED WITH SPECIALISTS PRIOR TO +UPT   Yeast infection    Past Surgical History:  Procedure Laterality Date   CESAREAN SECTION     primary low -transverse   WISDOM TOOTH EXTRACTION     ONLY 2 TEETH SO FAR     Current Meds  Medication Sig   Multiple Vitamin (MULTIVITAMIN ADULT PO) Take by mouth daily.   olmesartan (BENICAR) 20 MG tablet Take 1 tablet (20 mg total) by mouth daily.   OZEMPIC, 0.25 OR 0.5 MG/DOSE, 2 MG/3ML SOPN once a week.     Allergies:   Patient has no known allergies.   Social History   Tobacco Use   Smoking status: Never   Smokeless tobacco: Never  Vaping Use   Vaping status: Never Used  Substance Use Topics   Alcohol use: No   Drug use: No     Family Hx: The patient's family history includes Alcohol abuse in her maternal uncle; Anemia in her mother; Cancer in her paternal grandmother; Diabetes in her maternal grandfather and maternal grandmother; Drug abuse in her maternal uncle; Hypertension in her maternal grandmother; Kidney disease in her cousin and maternal uncle; Mental retardation in her paternal uncle. There is no history of  Anesthesia problems.  ROS:   Please see the history of present illness.    Rash  All other systems reviewed and are negative.   Prior CV studies:   The following studies were reviewed today:  Echo and coronary calcium ct Labs/Other Tests and Data Reviewed:    EKG:  No ECG reviewed.  Recent Labs: No results found for requested labs within last 365 days.   Recent Lipid Panel No results found for: "CHOL", "TRIG", "HDL", "CHOLHDL", "LDLCALC", "LDLDIRECT"  Wt Readings from Last 3 Encounters:  08/07/23 190 lb (86.2 kg)  04/17/23 202 lb (91.6 kg)  03/12/23 201 lb 12.8 oz (91.5 kg)     Objective:    Vital Signs:  BP (!) 121/91 (BP Location: Left Arm, Patient Position: Sitting, Cuff Size: Normal)   Pulse 71   Ht 5\' 4"  (1.626 m)   Wt 190 lb (86.2 kg)   BMI  32.61 kg/m     ASSESSMENT & PLAN:    Hypertension  - suspect that the amlodipine may have led to the rash. Continue the olmesartan 20 mg daily - she stop this while on amlodipine. If rash does not resolve will refer her dermatology DM2 - continue current medication regimen. Managed by pcp. May need to be started on low intensity statin in the setting of her DM2 and age 31 and hypertension - per guideline , will defere to pcp. If not started by our next visit I will start.  Obesity - continue diet modification and exercise   COVID-19 Education: The signs and symptoms of COVID-19 were discussed with the patient and how to seek care for testing (follow up with PCP or arrange E-visit).  The importance of social distancing was discussed today.  Time:   Today, I have spent 12 minutes with the patient with telehealth technology discussing the above problems.     Medication Adjustments/Labs and Tests Ordered: Current medicines are reviewed at length with the patient today.  Concerns regarding medicines are outlined above.   Tests Ordered: No orders of the defined types were placed in this encounter.   Medication Changes: Meds ordered this encounter  Medications   olmesartan (BENICAR) 20 MG tablet    Sig: Take 1 tablet (20 mg total) by mouth daily.    Dispense:  90 tablet    Refill:  3    Follow Up:  In Person   Signed, Deral Schellenberg, DO  08/08/2023 7:02 PM    [facility]

## 2023-08-24 ENCOUNTER — Other Ambulatory Visit: Payer: Self-pay

## 2023-08-24 MED ORDER — OLMESARTAN MEDOXOMIL 40 MG PO TABS: 40.0000 mg | ORAL_TABLET | Freq: Every day | ORAL | 3 refills | Status: DC

## 2023-08-24 MED ORDER — HYDROCHLOROTHIAZIDE 12.5 MG PO CAPS: 12.5000 mg | ORAL_CAPSULE | Freq: Every day | ORAL | 3 refills | Status: DC

## 2023-08-24 NOTE — Progress Notes (Signed)
 Prescription sent to pharmacy.

## 2024-01-21 ENCOUNTER — Institutional Professional Consult (permissible substitution) (HOSPITAL_BASED_OUTPATIENT_CLINIC_OR_DEPARTMENT_OTHER): Admitting: Family

## 2024-04-14 ENCOUNTER — Ambulatory Visit (HOSPITAL_BASED_OUTPATIENT_CLINIC_OR_DEPARTMENT_OTHER): Admitting: Family

## 2024-04-14 ENCOUNTER — Encounter (HOSPITAL_BASED_OUTPATIENT_CLINIC_OR_DEPARTMENT_OTHER): Payer: Self-pay | Admitting: Family

## 2024-04-14 VITALS — BP 125/88 | Ht 64.0 in | Wt 200.2 lb

## 2024-04-14 DIAGNOSIS — I10 Essential (primary) hypertension: Secondary | ICD-10-CM | POA: Diagnosis not present

## 2024-04-14 DIAGNOSIS — Z8249 Family history of ischemic heart disease and other diseases of the circulatory system: Secondary | ICD-10-CM | POA: Diagnosis not present

## 2024-04-14 DIAGNOSIS — R400 Somnolence: Secondary | ICD-10-CM | POA: Diagnosis not present

## 2024-04-14 DIAGNOSIS — E66811 Obesity, class 1: Secondary | ICD-10-CM | POA: Diagnosis not present

## 2024-04-14 MED ORDER — OLMESARTAN MEDOXOMIL-HCTZ 40-25 MG PO TABS: 1.0000 | ORAL_TABLET | Freq: Every day | ORAL | 1 refills | Status: AC

## 2024-04-14 NOTE — Progress Notes (Signed)
 [facility] Initial Assessment:    Date:  04/14/2024   ID:  Sharon Mcbride, DOB 1985/05/30, MRN [id number]  PCP:  Claudene Round, MD  Cardiologist:  Dub Huntsman, DO  Nephrologist:  Referring MD: Rutherford Gain, MD   CC: Hypertension  History of Present Illness:    Sharon Mcbride is a 38 y.o. female with a hx of HTN, DM2, obesity here to establish care in the [facility].   Calcium score 03/2023 of 0. Echo 04/16/23 LVEF 60-65%, no RWMA, indeterminate diastolic parameters, mild AI.   Evaluated by Dr. Huntsman 04/17/23 with elevated BP. Olmesartan -hydrochlorothiazide  continued and Amlodipine  5mg  daily added. Video visit with Dr. Huntsman 08/07/23. She noted rash on Amlodipine  and discontinued. She had stopped Olmesartan  while on Amlodipine  and was encouraged to resume Olmesartan  20mg  daily.   Discussed the use of AI scribe software for clinical note transcription with the patient, who gave verbal consent to proceed.  History of Present Illness Sharon Mcbride is a 38 year old female with hypertension who presents for management of high blood pressure. She was referred by Dr. Rutherford for management of hypertension after BP in clinic 142/92.  She was diagnosed with hypertension about one year ago. She checks blood pressure at home with an arm cuff, with prior readings around 130-140/90 mmHg, but has not been checking regularly recently.  She takes olmesartan  40 mg and hydrochlorothiazide  12.5 mg for blood pressure. She previously had a rash that she thought might be Amlodipine -related, no longer takes Amlodipine . She also takes a Ozempic for diabetes, recently increased to 2 mg for elevated blood sugars by her PCP.  She has increased exercise and changed her diet since diagnosis, exercising five days per week with cardio and strength training. She has stopped alcohol and markedly reduced salt intake. She follows a vegetarian diet and mainly eats at home.  She  gained about 50 pounds since 2020 but has been losing weight since diagnosis from a starting weight of 214-215 pounds, which she attributes to exercise and dietary changes.  Previous antihypertensives: Amlodipine  - rash  Past Medical History:  Diagnosis Date   Abnormal Pap smear 04/28/2002   colpo.,2004; inflammation, 2006   Allergy    seasonal   Anemia 06/12/2008   Diabetes mellitus without complication (HCC)    H/O candidiasis    H/O hemorrhoids 12/03/2010   H/O varicella    History of abnormal Pap smear 10/03/2011   colpo 2004   History of hepatitis B    vaccine   History of induced abortion 10/03/2011   EAB 2008 at 12 weeks   Hypertension    Trichomonas    Varicose veins of legs    HAD TX SCHED WITH SPECIALISTS PRIOR TO +UPT   Yeast infection     Past Surgical History:  Procedure Laterality Date   CESAREAN SECTION     primary low -transverse   WISDOM TOOTH EXTRACTION     ONLY 2 TEETH SO FAR    Current Medications: Active Medications[1]   Allergies:   Patient has no known allergies.   Social History   Socioeconomic History   Marital status: Single    Spouse name: DARRICK Sandner   Number of children: 1   Years of education: 16   Highest education level: Not on file  Occupational History   Occupation: MANAGEMENT    Employer: [employer]  Tobacco Use   Smoking status: Never   Smokeless tobacco: Never  Vaping Use  Vaping status: Never Used  Substance and Sexual Activity   Alcohol use: Not Currently    Comment: Havent had any alcohol in over a year.   Drug use: Never   Sexual activity: Not Currently    Partners: Male    Birth control/protection: Abstinence, I.U.D., None    Comment: currently pregnant  Other Topics Concern   Not on file  Social History Narrative   Not on file   Social Drivers of Health   Tobacco Use: Low Risk (04/14/2024)   Patient History    Smoking Tobacco Use: Never    Smokeless Tobacco Use: Never    Passive Exposure:  Not on file  Financial Resource Strain: Low Risk (04/14/2024)   Overall Financial Resource Strain (CARDIA)    Difficulty of Paying Living Expenses: Not very hard  Food Insecurity: No Food Insecurity (04/14/2024)   Epic    Worried About Radiation Protection Practitioner of Food in the Last Year: Never true    Ran Out of Food in the Last Year: Never true  Transportation Needs: No Transportation Needs (04/14/2024)   Epic    Lack of Transportation (Medical): No    Lack of Transportation (Non-Medical): No  Physical Activity: Sufficiently Active (04/14/2024)   Exercise Vital Sign    Days of Exercise per Week: 5 days    Minutes of Exercise per Session: 30 min  Stress: No Stress Concern Present (04/14/2024)   [employer] of Occupational Health - Occupational Stress Questionnaire    Feeling of Stress: Not at all  Social Connections: Moderately Integrated (04/14/2024)   Social Connection and Isolation Panel    Frequency of Communication with Friends and Family: More than three times a week    Frequency of Social Gatherings with Friends and Family: Twice a week    Attends Religious Services: More than 4 times per year    Active Member of Clubs or Organizations: Yes    Attends Banker Meetings: More than 4 times per year    Marital Status: Divorced  Depression (PHQ2-9): Low Risk (04/14/2024)   Depression (PHQ2-9)    PHQ-2 Score: 0  Alcohol Screen: Low Risk (04/14/2024)   Alcohol Screen    Last Alcohol Screening Score (AUDIT): 0  Housing: Low Risk (04/14/2024)   Epic    Unable to Pay for Housing in the Last Year: No    Number of Times Moved in the Last Year: 0    Homeless in the Last Year: No  Utilities: Not At Risk (04/14/2024)   Epic    Threatened with loss of utilities: No  Health Literacy: Adequate Health Literacy (04/14/2024)   B1300 Health Literacy    Frequency of need for help with medical instructions: Never     Family History: The patient's family history includes Alcohol  abuse in her maternal uncle; Anemia in her mother; Cancer in her paternal grandmother; Diabetes in her maternal grandfather, maternal grandmother, and mother; Drug abuse in her maternal uncle; Heart attack in her mother; Hypertension in her maternal grandmother and mother; Kidney disease in her cousin and maternal uncle; Mental retardation in her paternal uncle. There is no history of Anesthesia problems.  ROS:   Please see the history of present illness.     All other systems reviewed and are negative.  EKGs/Labs/Other Studies Reviewed:    EKG Interpretation Date/Time:  Thursday April 14 2024 08:19:56 EST Ventricular Rate:  88 PR Interval:  158 QRS Duration:  74 QT Interval:  338 QTC Calculation: 408 R  Axis:   -21  Text Interpretation: Normal sinus rhythm Left ventricular hypertrophy ( R in aVL , Cornell product , Romhilt-Estes ) Confirmed by Vannie Mora ([id number]) on 04/14/2024 8:32:41 AM    Recent Labs: No results found for requested labs within last 365 days.   Recent Lipid Panel No results found for: CHOL, TRIG, HDL, CHOLHDL, VLDL, LDLCALC, LDLDIRECT  No results found for: LIPOA   Cardiac Studies & Procedures   ______________________________________________________________________________________________     ECHOCARDIOGRAM  ECHOCARDIOGRAM COMPLETE 04/16/2023  Narrative ECHOCARDIOGRAM REPORT    Patient Name:   CHANTE MAYSON Date of Exam: 04/16/2023 Medical Rec #:  [id number]     Height:       64.0 in Accession #:    [id number]    Weight:       201.8 lb Date of Birth:  1985-12-16     BSA:          1.964 m Patient Age:    37 years      BP:           135/107 mmHg Patient Gender: F             HR:           49 bpm. Exam Location:  Outpatient  Procedure: 3D Echo, 2D Echo, Color Doppler, Cardiac Doppler and Strain Analysis  Indications:    Dyspna/Fatigue  History:        Patient has no prior history of Echocardiogram  examinations. Signs/Symptoms:Shortness of Breath and Fatigue; Risk Factors:Hypertension and Non-Smoker. Premature family history of heart disease in her father and diabetes,.  Sonographer:    Orvil Holmes RDCS Referring Phys: [id number] KARDIE TOBB  IMPRESSIONS   1. Left ventricular ejection fraction, by estimation, is 60 to 65%. The left ventricle has normal function. The left ventricle has no regional wall motion abnormalities. Left ventricular diastolic parameters are indeterminate. 2. Right ventricular systolic function is normal. The right ventricular size is normal. 3. The mitral valve is normal in structure. Trivial mitral valve regurgitation. No evidence of mitral stenosis. 4. The aortic valve is tricuspid. Aortic valve regurgitation is mild. No aortic stenosis is present. 5. The inferior vena cava is normal in size with greater than 50% respiratory variability, suggesting right atrial pressure of 3 mmHg.  FINDINGS Left Ventricle: Left ventricular ejection fraction, by estimation, is 60 to 65%. The left ventricle has normal function. The left ventricle has no regional wall motion abnormalities. The left ventricular internal cavity size was normal in size. There is no left ventricular hypertrophy. Left ventricular diastolic parameters are indeterminate.  Right Ventricle: The right ventricular size is normal. No increase in right ventricular wall thickness. Right ventricular systolic function is normal.  Left Atrium: Left atrial size was normal in size.  Right Atrium: Right atrial size was normal in size.  Pericardium: There is no evidence of pericardial effusion.  Mitral Valve: The mitral valve is normal in structure. Trivial mitral valve regurgitation. No evidence of mitral valve stenosis.  Tricuspid Valve: The tricuspid valve is normal in structure. Tricuspid valve regurgitation is not demonstrated. No evidence of tricuspid stenosis.  Aortic Valve: The aortic valve is  tricuspid. Aortic valve regurgitation is mild. No aortic stenosis is present.  Pulmonic Valve: The pulmonic valve was normal in structure. Pulmonic valve regurgitation is mild. No evidence of pulmonic stenosis.  Aorta: The aortic root is normal in size and structure.  Venous: The inferior vena cava is normal in size with greater than 50%  respiratory variability, suggesting right atrial pressure of 3 mmHg.  IAS/Shunts: No atrial level shunt detected by color flow Doppler.   LEFT VENTRICLE PLAX 2D LVIDd:         4.75 cm   Diastology LVIDs:         2.86 cm   LV e' medial:    5.55 cm/s LV PW:         0.91 cm   LV E/e' medial:  13.9 LV IVS:        0.89 cm   LV e' lateral:   8.49 cm/s LVOT diam:     2.10 cm   LV E/e' lateral: 9.1 LV SV:         68 LV SV Index:   34 LVOT Area:     3.46 cm  3D Volume EF: 3D EF:        56 % LV EDV:       129 ml LV ESV:       57 ml LV SV:        72 ml  RIGHT VENTRICLE RV Basal diam:  3.61 cm RV Mid diam:    2.80 cm RV S prime:     12.50 cm/s TAPSE (M-mode): 1.7 cm  LEFT ATRIUM             Index        RIGHT ATRIUM          Index LA diam:        3.40 cm 1.73 cm/m   RA Area:     8.86 cm LA Vol (A2C):   42.2 ml 21.49 ml/m  RA Volume:   16.10 ml 8.20 ml/m LA Vol (A4C):   25.3 ml 12.88 ml/m LA Biplane Vol: 33.1 ml 16.85 ml/m AORTIC VALVE LVOT Vmax:         85.40 cm/s LVOT Vmean:        52.900 cm/s LVOT VTI:          0.195 m AR Vena Contracta: 0.25 cm  AORTA Ao Root diam: 3.20 cm Ao Asc diam:  3.40 cm  MITRAL VALVE               TRICUSPID VALVE MV Area (PHT): 4.39 cm    TR Peak grad:   13.5 mmHg MV Decel Time: 173 msec    TR Vmax:        184.00 cm/s MR Peak grad: 23.4 mmHg MR Vmax:      242.00 cm/s  SHUNTS MV E velocity: 77.00 cm/s  Systemic VTI:  0.20 m MV A velocity: 45.10 cm/s  Systemic Diam: 2.10 cm MV E/A ratio:  1.71  Oneil Parchment MD Electronically signed by Oneil Parchment MD Signature Date/Time: 04/16/2023/9:58:05  AM    Final      CT SCANS  CT CARDIAC SCORING (SELF PAY ONLY) 04/16/2023  Addendum 05/03/2023  1:17 AM ADDENDUM REPORT: 05/03/2023 01:15  EXAM: OVER-READ INTERPRETATION  CT CHEST  The following report is an over-read performed by radiologist Dr. Oneil Devonshire of [facility] on 05/03/2023. This over-read does not include interpretation of cardiac or coronary anatomy or pathology. The coronary calcium score interpretation by the cardiologist is attached.  COMPARISON:  None.  FINDINGS: Cardiovascular: There are no significant extracardiac vascular findings.  Mediastinum/Nodes: There are no enlarged lymph nodes within the visualized mediastinum.  Lungs/Pleura: There is no pleural effusion. The visualized lungs appear clear.  Upper abdomen: No significant findings in the visualized upper abdomen.  Musculoskeletal/Chest wall: No chest wall mass or suspicious osseous findings within the visualized chest.  IMPRESSION: No significant extracardiac findings within the visualized chest.   Electronically Signed By: Oneil Devonshire M.D. On: 05/03/2023 01:15  Narrative CLINICAL DATA:  Cardiovascular Disease Risk stratification  EXAM: Coronary Calcium Score  TECHNIQUE: A gated, non-contrast computed tomography scan of the heart was performed using 3mm slice thickness. Axial images were analyzed on a dedicated workstation. Calcium scoring of the coronary arteries was performed using the Agatston method.  FINDINGS: Coronary arteries: Normal origins.  Coronary Calcium Score:  Left main: 0  Left anterior descending artery: 0  Left circumflex artery: 0  Right coronary artery: 0  Total: 0  Percentile: NA--under age cutoff  Pericardium: Normal.  Aorta: Normal caliber of ascending aorta. No aortic atherosclerosis noted.  Non-cardiac: See separate report from [facility].  IMPRESSION: Coronary calcium score of 0. This was NA--under age  cutoff percentile for age-, race-, and sex-matched controls.  RECOMMENDATIONS: Coronary artery calcium (CAC) score is a strong predictor of incident coronary heart disease (CHD) and provides predictive information beyond traditional risk factors. CAC scoring is reasonable to use in the decision to withhold, postpone, or initiate statin therapy in intermediate-risk or selected borderline-risk asymptomatic adults (age 71-75 years and LDL-C >=70 to <190 mg/dL) who do not have diabetes or established atherosclerotic cardiovascular disease (ASCVD).* In intermediate-risk (10-year ASCVD risk >=7.5% to <20%) adults or selected borderline-risk (10-year ASCVD risk >=5% to <7.5%) adults in whom a CAC score is measured for the purpose of making a treatment decision the following recommendations have been made:  If CAC=0, it is reasonable to withhold statin therapy and reassess in 5 to 10 years, as long as higher risk conditions are absent (diabetes mellitus, family history of premature CHD in first degree relatives (males <55 years; females <65 years), cigarette smoking, or LDL >=190 mg/dL).  If CAC is 1 to 99, it is reasonable to initiate statin therapy for patients >=60 years of age.  If CAC is >=100 or >=75th percentile, it is reasonable to initiate statin therapy at any age.  Cardiology referral should be considered for patients with CAC scores >=400 or >=75th percentile.  *2018 AHA/ACC/AACVPR/AAPA/ABC/ACPM/ADA/AGS/APhA/ASPC/NLA/PCNA Guideline on the Management of Blood Cholesterol: A Report of the American College of Cardiology/American Heart Association Task Force on Clinical Practice Guidelines. J Am Coll Cardiol. 2019;73(24):3168-3209.  Shelda Bruckner, MD  Electronically Signed: By: Shelda Bruckner M.D. On: 04/16/2023 15:00     ______________________________________________________________________________________________         Physical Exam:   VS:  BP  125/88 (BP Location: Right Arm)   Ht 5' 4 (1.626 m)   Wt 200 lb 3.2 oz (90.8 kg)   SpO2 98%   BMI 34.36 kg/m  , BMI Body mass index is 34.36 kg/m. GENERAL:  Well appearing, overweight HEENT: Pupils equal round and reactive, fundi not visualized, oral mucosa unremarkable NECK:  No jugular venous distention, waveform within normal limits, carotid upstroke brisk and symmetric, no bruits, no thyromegaly LYMPHATICS:  No cervical adenopathy LUNGS:  Clear to auscultation bilaterally HEART:  RRR.  PMI not displaced or sustained,S1 and S2 within normal limits, no S3, no S4, no clicks, no rubs, no murmurs ABD:  Flat, positive bowel sounds normal in frequency in pitch, no bruits, no rebound, no guarding, no midline pulsatile mass, no hepatomegaly, no splenomegaly EXT:  2 plus pulses throughout, no edema, no cyanosis no clubbing SKIN:  No rashes no nodules NEURO:  Cranial nerves  II through XII grossly intact, motor grossly intact throughout [facility]:  Cognitively intact, oriented to person place and time   ASSESSMENT/PLAN:    Assessment & Plan Primary hypertension Blood pressure controlled with olmesartan  and hydrochlorothiazide . Discussed isometric exercises, caffeine reduction, and potential sleep apnea. Explained benefits of combination pill for adherence. Prior intolerance to Amlodipine  (rash). -stop olmesartan  40 and hydrochlorothiazide  12.5. - Prescribed combination pill of olmesartan  40 -hydrochlorothiazide  25 mg. -BMP in 1-2 weeks. - Ordered home sleep study to evaluate for sleep apnea. - Advised checking blood pressure at least three times a week at home, ensuring to rest for five minutes before measurement. - Advised bringing home blood pressure cuff to next appointment to ensure accuracy.  Family history of cardiovascular disease Stable with no anginal symptoms. No indication for ischemic evaluation.   -Lpa with labs in 1-2 weeks.  Obesity Weight gain addressed with exercise and  dietary changes. Discussed impact on blood pressure and importance of sustainable weight loss. - Continue current exercise regimen, including isometric exercises. - Encouraged sustainable weight loss through diet and exercise.  Type 2 diabetes mellitus Elevated blood sugar managed with Ozempic.  - Increase Ozempic to 2 mg as prescribed by PCP. - Advised taking Ozempic in the evening to reduce bloating. - Continue dietary management and exercise regimen.  Daytime somnolence Discussed potential link between sleep apnea and hypertension. Evaluating with home sleep study. STOPBang 4.  - Ordered home sleep study to evaluate for sleep apnea.    Screening for Secondary Hypertension:     04/14/2024    9:17 AM  Causes  Drugs/Herbals Screened     - Comments no OTC agents, one caffeinated drink per day  Sleep Apnea Screened     - Comments 04/14/24 itamar home sleep study ordered  Coarctation of the Aorta N/A     - Comments symmetric BP  Compliance Screened     - Comments takes medications regularly    Relevant Labs/Studies:                     Disposition:    FU with MD/APP/PharmD in 3 months    Medication Adjustments/Labs and Tests Ordered: Current medicines are reviewed at length with the patient today.  Concerns regarding medicines are outlined above.  Orders Placed This Encounter  Procedures   Basic metabolic panel with GFR   Lipoprotein A (LPA)   EKG 12-Lead   [facility] Sleep Study   Meds ordered this encounter  Medications   olmesartan -hydrochlorothiazide  (BENICAR  HCT) 40-25 MG tablet    Sig: Take 1 tablet by mouth daily.    Dispense:  90 tablet    Refill:  1    Supervising Provider:   LONNI SLAIN [[id number]]     Signed, Reche GORMAN Finder, NP  04/14/2024 9:18 AM    [facility]     [1]  Current Meds  Medication Sig   Continuous Glucose Sensor (DEXCOM G7 SENSOR) MISC SMARTSIG:1 Each Every Hour   Multiple Vitamin  (MULTIVITAMIN ADULT PO) Take by mouth daily.   olmesartan -hydrochlorothiazide  (BENICAR  HCT) 40-25 MG tablet Take 1 tablet by mouth daily.   OZEMPIC, 1 MG/DOSE, 4 MG/3ML SOPN    [DISCONTINUED] amLODipine  (NORVASC ) 5 MG tablet Take 1 tablet (5 mg total) by mouth daily.   [DISCONTINUED] hydrochlorothiazide  (MICROZIDE ) 12.5 MG capsule Take 1 capsule (12.5 mg total) by mouth daily.   [DISCONTINUED] olmesartan  (BENICAR ) 40 MG tablet Take 1 tablet (40 mg total) by mouth daily.   [  DISCONTINUED] OZEMPIC, 0.25 OR 0.5 MG/DOSE, 2 MG/3ML SOPN once a week.

## 2024-04-14 NOTE — Patient Instructions (Addendum)
 Medication Instructions:   Remain off Amlodipine   STOP hydrochlorothiazide   STOP Olmesartan   START Olmesartan -hydrochlorothiazide  40-25mg  daily   Labwork: Your physician recommends that you return for lab work in 1-2 weeks for BMP, Lp(a)   Testing/Procedures: Your EKG today looked great!  Your provider has ordered  WatchPAT?  Is a FDA cleared portable home sleep study test that uses a watch and 3 points of contact to monitor 7 different channels, including your heart rate, oxygen saturations, body position, snoring, and chest motion.  The study is easy to use from the comfort of your own home and accurately detect sleep apnea. We have ordered this study and once approved by insurance it will be mailed to your home.     Follow-Up: Please follow up in 3 months in [facility] with Dr. Raford, Reche Finder, NP or Allean Mink PharmD    Special Instructions:    Bring blood pressure cuff to your next office visit please  Tips to Measure your Blood Pressure Correctly  Here's what you can do to ensure a correct reading:  Don't drink a caffeinated beverage or smoke during the 30 minutes before the test.  Sit quietly for five minutes before the test begins.  During the measurement, sit in a chair with your feet on the floor and your arm supported so your elbow is at about heart level.  The inflatable part of the cuff should completely cover at least 80% of your upper arm, and the cuff should be placed on bare skin, not over a shirt.  Don't talk during the measurement.  Have your blood pressure measured twice, with a brief break in between. If the readings are different by 5 points or more, have it done a third time.  Blood pressure categories  Blood pressure category SYSTOLIC (upper number)  DIASTOLIC (lower number)  Normal Less than 120 mm Hg and Less than 80 mm Hg  Elevated 120-129 mm Hg and Less than 80 mm Hg  High blood pressure: Stage 1 hypertension 130-139 mm Hg or  80-89 mm Hg  High blood pressure: Stage 2 hypertension 140 mm Hg or higher or 90 mm Hg or higher  Hypertensive crisis (consult your doctor immediately) Higher than 180 mm Hg and/or Higher than 120 mm Hg  Source: American Heart Association and American Stroke Association. For more on getting your blood pressure under control, buy Controlling Your Blood Pressure, a Special Health Report from [facility].

## 2024-05-02 ENCOUNTER — Encounter (HOSPITAL_BASED_OUTPATIENT_CLINIC_OR_DEPARTMENT_OTHER): Payer: Self-pay | Admitting: Cardiology

## 2024-05-02 DIAGNOSIS — R0683 Snoring: Secondary | ICD-10-CM | POA: Diagnosis not present

## 2024-05-03 NOTE — Procedures (Signed)
" ° ° ° °  SLEEP STUDY REPORT Patient Information Study Date: 05/02/2024 Patient Name: Sharon Mcbride Patient ID: [id number] Birth Date: [birth year] Age: 39 Gender: Female BMI: 34.3 (W=200 lb, H=5' 4'') Stopbang:  Referring Physician: Reche Finder, NP  TEST DESCRIPTION: Home sleep apnea testing was completed using the WatchPat, a Type 1 device, utilizing  peripheral arterial tonometry (PAT), chest movement, actigraphy, pulse oximetry, pulse rate, body position and snore.  AHI was calculated with apnea and hypopnea using valid sleep time as the denominator. RDI includes apneas,  hypopneas, and RERAs. The data acquired and the scoring of sleep and all associated events were performed in  accordance with the recommended standards and specifications as outlined in the AASM Manual for the Scoring of  Sleep and Associated Events 2.2.0 (2015).   FINDINGS: 1. No evidence of Obstructive Sleep Apnea with AHI 3.8/hr.  2. No Central Sleep Apnea. 3. Oxygen desaturations as low as 92%. 4. Minimal snoring was present. O2 sats were < 88% for 0 minutes. 5. Total sleep time was 5 hrs and 47 min. 6. 31.9% of total sleep time was spent in REM sleep.  7. Shortened sleep onset latency at 5 min.  8. Shortened REM sleep onset latency at 41 min.  9. Total awakenings were 1.   DIAGNOSIS:  Normal study with no significant sleep disordered breathing.  RECOMMENDATIONS: 1. Normal study with no significant sleep disordered breathing. 2. Healthy sleep recommendations include: adequate nightly sleep (normal 7-9 hrs/night), avoidance of caffeine after  noon and alcohol near bedtime, and maintaining a sleep environment that is cool, dark and quiet. 3. Weight loss for overweight patients is recommended.  4. Snoring recommendations include: weight loss where appropriate, side sleeping, and avoidance of alcohol before  bed. 5. Operation of motor vehicle or dangerous equipment must be avoided when feeling drowsy,  excessively sleepy, or  mentally fatigued.  6. An ENT consultation which may be useful for specific causes of and possible treatment of bothersome snoring .  7. Weight loss may be of benefit in reducing the severity of snoring.   Signature: Wilbert Bihari, MD; [facility]; Diplomat, American Board of Sleep  Medicine Electronically Signed: 05/03/2024 11:33:11 AM  "

## 2024-05-12 ENCOUNTER — Other Ambulatory Visit: Payer: Self-pay | Admitting: Cardiology

## 2024-05-17 ENCOUNTER — Telehealth: Payer: Self-pay | Admitting: *Deleted

## 2024-05-17 NOTE — Telephone Encounter (Signed)
 Patient notified of result via her mychart.

## 2024-05-17 NOTE — Telephone Encounter (Signed)
-----   Message from Wilbert Bihari, MD sent at 05/03/2024 11:35 AM EST ----- Please let patient know that sleep study showed no significant sleep apnea.

## 2024-07-18 ENCOUNTER — Encounter (HOSPITAL_BASED_OUTPATIENT_CLINIC_OR_DEPARTMENT_OTHER): Admitting: Family
